# Patient Record
Sex: Female | Born: 2000 | Race: Black or African American | Hispanic: No | Marital: Single | Smoking: Never smoker
Health system: Southern US, Community
[De-identification: ages and names within clinical notes are randomized; demographics above are authoritative.]

## PROBLEM LIST (undated history)

## (undated) ENCOUNTER — Inpatient Hospital Stay (HOSPITAL_COMMUNITY): Payer: Self-pay

## (undated) DIAGNOSIS — O139 Gestational [pregnancy-induced] hypertension without significant proteinuria, unspecified trimester: Secondary | ICD-10-CM

## (undated) DIAGNOSIS — F419 Anxiety disorder, unspecified: Secondary | ICD-10-CM

## (undated) DIAGNOSIS — Z789 Other specified health status: Secondary | ICD-10-CM

## (undated) DIAGNOSIS — D649 Anemia, unspecified: Secondary | ICD-10-CM

## (undated) DIAGNOSIS — R519 Headache, unspecified: Secondary | ICD-10-CM

## (undated) DIAGNOSIS — F32A Depression, unspecified: Secondary | ICD-10-CM

## (undated) HISTORY — DX: Other specified health status: Z78.9

## (undated) HISTORY — PX: NO PAST SURGERIES: SHX2092

---
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
## 2019-07-18 ENCOUNTER — Encounter (HOSPITAL_COMMUNITY): Payer: Self-pay | Admitting: Emergency Medicine

## 2019-07-18 ENCOUNTER — Encounter (HOSPITAL_COMMUNITY): Payer: Self-pay

## 2019-07-18 ENCOUNTER — Emergency Department (HOSPITAL_COMMUNITY): Payer: Medicaid Other

## 2019-07-18 ENCOUNTER — Emergency Department (HOSPITAL_COMMUNITY)
Admission: EM | Admit: 2019-07-18 | Discharge: 2019-07-19 | Disposition: A | Discharge status: Home / Self Care | Payer: Medicaid Other | Attending: Emergency Medicine | Admitting: Emergency Medicine

## 2019-07-18 ENCOUNTER — Ambulatory Visit (HOSPITAL_COMMUNITY)
Admission: EM | Admit: 2019-07-18 | Discharge: 2019-07-18 | Disposition: A | Discharge status: Home / Self Care | Payer: Medicaid Other | Attending: Emergency Medicine | Admitting: Emergency Medicine

## 2019-07-18 ENCOUNTER — Other Ambulatory Visit: Payer: Self-pay

## 2019-07-18 DIAGNOSIS — Z0189 Encounter for other specified special examinations: Secondary | ICD-10-CM

## 2019-07-18 DIAGNOSIS — Y929 Unspecified place or not applicable: Secondary | ICD-10-CM | POA: Diagnosis not present

## 2019-07-18 DIAGNOSIS — Y999 Unspecified external cause status: Secondary | ICD-10-CM | POA: Diagnosis not present

## 2019-07-18 DIAGNOSIS — S92515A Nondisplaced fracture of proximal phalanx of left lesser toe(s), initial encounter for closed fracture: Secondary | ICD-10-CM

## 2019-07-18 DIAGNOSIS — Z3202 Encounter for pregnancy test, result negative: Secondary | ICD-10-CM

## 2019-07-18 DIAGNOSIS — Y9302 Activity, running: Secondary | ICD-10-CM | POA: Diagnosis not present

## 2019-07-18 DIAGNOSIS — W2201XA Walked into wall, initial encounter: Secondary | ICD-10-CM | POA: Insufficient documentation

## 2019-07-18 DIAGNOSIS — S99922A Unspecified injury of left foot, initial encounter: Secondary | ICD-10-CM | POA: Diagnosis present

## 2019-07-18 LAB — POCT PREGNANCY, URINE: Preg Test, Ur: NEGATIVE

## 2019-07-18 NOTE — Discharge Instructions (Addendum)
Your urine pregnancy test is negative.

## 2019-07-18 NOTE — ED Triage Notes (Signed)
Pt. Stated she turned a corner fast after running through the house and heard a crack in the left two toes.

## 2019-07-18 NOTE — ED Triage Notes (Signed)
Patient presents to Urgent Care with complaints of wanting a pregnancy test. Patient reports she took tests at home and they were negative, pt has not missed a period, but her family members told her she can still have a period if she is pregnant.

## 2019-07-18 NOTE — ED Provider Notes (Signed)
[facility]    CSN: [id number] Arrival date & time: 07/18/19  0802      History   Chief Complaint Chief Complaint  Patient presents with  . Possible Pregnancy    HPI Paula Nunez is a 18 y.o. female.   Patient presents with request for pregnancy test.  She reports intermittent nausea and "sore" breast times several weeks.  She has not missed any periods but would like to be tested.  She denies vomiting, diarrhea, abdominal pain, dysuria, back pain, or other symptoms.  LMP: 07/06/2019.  She reports taking home test and it was negative.  The history is provided by the patient.    History reviewed. No pertinent past medical history.  There are no active problems to display for this patient.   History reviewed. No pertinent surgical history.  OB History   No obstetric history on file.      Home Medications    Prior to Admission medications   Not on File    Family History Family History  Problem Relation Age of Onset  . Healthy Mother   . Healthy Father     Social History Social History   Tobacco Use  . Smoking status: Never Smoker  . Smokeless tobacco: Never Used  Substance Use Topics  . Alcohol use: Not Currently  . Drug use: Not on file     Allergies   Patient has no known allergies.   Review of Systems Review of Systems  Constitutional: Negative for chills and fever.  HENT: Negative for ear pain and sore throat.   Eyes: Negative for pain and visual disturbance.  Respiratory: Negative for cough and shortness of breath.   Cardiovascular: Negative for chest pain and palpitations.  Gastrointestinal: Positive for nausea. Negative for abdominal pain and vomiting.  Genitourinary: Negative for dysuria and hematuria.  Musculoskeletal: Negative for arthralgias and back pain.  Skin: Negative for color change and rash.  Neurological: Negative for seizures and syncope.  All other systems reviewed and are negative.    Physical Exam  Triage Vital Signs ED Triage Vitals  Enc Vitals Group     BP      Pulse      Resp      Temp      Temp src      SpO2      Weight      Height      Head Circumference      Peak Flow      Pain Score      Pain Loc      Pain Edu?      Excl. in [place]?    No data found.  Updated Vital Signs BP 133/60 (BP Location: Left Arm)   Pulse 71   Temp 98.7 F (37.1 C) (Oral)   Resp 16   LMP 07/06/2019 (Exact Date)   SpO2 100%   Visual Acuity Right Eye Distance:   Left Eye Distance:   Bilateral Distance:    Right Eye Near:   Left Eye Near:    Bilateral Near:     Physical Exam Vitals signs and nursing note reviewed.  Constitutional:      General: She is not in acute distress.    Appearance: She is well-developed.  HENT:     Head: Normocephalic and atraumatic.     Mouth/Throat:     Mouth: Mucous membranes are moist.     Pharynx: Oropharynx is clear.  Eyes:     Conjunctiva/sclera: Conjunctivae  normal.  Neck:     Musculoskeletal: Neck supple.  Cardiovascular:     Rate and Rhythm: Normal rate and regular rhythm.     Heart sounds: No murmur.  Pulmonary:     Effort: Pulmonary effort is normal. No respiratory distress.     Breath sounds: Normal breath sounds.  Abdominal:     General: Bowel sounds are normal.     Palpations: Abdomen is soft.     Tenderness: There is no abdominal tenderness. There is no right CVA tenderness, left CVA tenderness, guarding or rebound.  Skin:    General: Skin is warm and dry.     Findings: No rash.  Neurological:     Mental Status: She is alert.  Psychiatric:        Mood and Affect: Mood normal.        Behavior: Behavior normal.      UC Treatments / Results  Labs (all labs ordered are listed, but only abnormal results are displayed) Labs Reviewed  POC URINE PREG, ED  POCT PREGNANCY, URINE    EKG   Radiology No results found.  Procedures Procedures (including critical care time)  Medications Ordered in UC Medications - No data  to display  Initial Impression / Assessment and Plan / UC Course  I have reviewed the triage vital signs and the nursing notes.  Pertinent labs & imaging results that were available during my care of the patient were reviewed by me and considered in my medical decision making (see chart for details).   Patient request for pregnancy test due to intermittent nausea and breast tenderness.  Urine pregnancy negative.  Discussed test results with patient and instructed her to follow-up with her PCP if her symptoms persist.  Patient agrees to plan of care. Final Clinical Impressions(s) / UC Diagnoses   Final diagnoses:  Patient request for diagnostic testing     Discharge Instructions     Your urine pregnancy test is negative.    ED Prescriptions    None     PDMP not reviewed this encounter.   Mickie Bail, NP 07/18/19 [phone]

## 2019-07-19 MED ORDER — IBUPROFEN 400 MG PO TABS
600.0000 mg | ORAL_TABLET | Freq: Once | ORAL | Status: AC
  Administered 2019-07-19: 600 mg via ORAL
  Filled 2019-07-19: qty 1

## 2019-07-19 MED ORDER — IBUPROFEN 600 MG PO TABS: 600.0000 mg | ORAL_TABLET | Freq: Four times a day (QID) | ORAL | 0 refills | Status: DC | PRN

## 2019-07-19 NOTE — ED Notes (Signed)
Patient verbalizes understanding of discharge instructions. Opportunity for questioning and answers were provided. Armband removed by staff, pt discharged from ED. Pt. ambulatory and discharged home.  

## 2019-07-19 NOTE — Discharge Instructions (Addendum)
Take ibuprofen for pain. Elevate to reduce any swelling. Follow up with your doctor as needed.

## 2019-07-19 NOTE — ED Provider Notes (Signed)
[facility] EMERGENCY DEPARTMENT Provider Note   CSN: [id number] Arrival date & time: 07/18/19  2313     History   Chief Complaint Chief Complaint  Patient presents with  . Leg Injury    HPI Paula Nunez is a 18 y.o. female.     Patient to ED with left 4th toe injury after hitting the interior corner of a wall while running. No other injury.   The history is provided by the patient. No language interpreter was used.    History reviewed. No pertinent past medical history.  There are no active problems to display for this patient.   History reviewed. No pertinent surgical history.   OB History   No obstetric history on file.      Home Medications    Prior to Admission medications   Not on File    Family History Family History  Problem Relation Age of Onset  . Healthy Mother   . Healthy Father     Social History Social History   Tobacco Use  . Smoking status: Never Smoker  . Smokeless tobacco: Never Used  Substance Use Topics  . Alcohol use: Not Currently  . Drug use: Not Currently     Allergies   Patient has no known allergies.   Review of Systems Review of Systems  Musculoskeletal:       See HPI.  Skin: Negative for color change and wound.  Neurological: Negative for numbness.     Physical Exam Updated Vital Signs BP 130/66 (BP Location: Left Arm)   Pulse 80   Temp 98.4 F (36.9 C) (Oral)   Resp 14   Ht 5\' 5"  (1.651 m)   Wt 79.4 kg   LMP 07/18/2019   SpO2 99%   BMI 29.12 kg/m   Physical Exam Constitutional:      Appearance: She is well-developed.  Neck:     Musculoskeletal: Normal range of motion.  Pulmonary:     Effort: Pulmonary effort is normal.  Musculoskeletal:     Comments: Left 4th toe minimally swollen without deformity or discoloration. Tender. No MT tenderness.   Skin:    General: Skin is warm and dry.     Findings: No erythema or lesion.  Neurological:     Mental Status: She is  alert and oriented to person, place, and time.     Sensory: No sensory deficit.      ED Treatments / Results  Labs (all labs ordered are listed, but only abnormal results are displayed) Labs Reviewed - No data to display  EKG None  Radiology Dg Foot Complete Left  Result Date: 07/19/2019 CLINICAL DATA:  Multiple toe injuries EXAM: LEFT FOOT - COMPLETE 3+ VIEW COMPARISON:  None. FINDINGS: There is a nondisplaced linear lucency, likely obliquely oriented fracture seen through the proximal fourth phalanx. No other fracture seen. There is no evidence of arthropathy or other focal bone abnormality. Dorsal soft tissue swelling. IMPRESSION: Probable nondisplaced fracture of the proximal fourth phalanx. Dorsal soft tissue swelling. Electronically Signed   By: 07/21/2019 M.D.   On: 07/19/2019 00:06    Procedures Procedures (including critical care time)  Medications Ordered in ED Medications  ibuprofen (ADVIL) tablet 600 mg (has no administration in time range)     Initial Impression / Assessment and Plan / ED Course  I have reviewed the triage vital signs and the nursing notes.  Pertinent labs & imaging results that were available during my care of the patient  were reviewed by me and considered in my medical decision making (see chart for details).        Patient to ED with left 4th toe injury. X-ray show ND fracture line of proximal phalynx.   Toe buddy taped, post op shoe provided for comfort. Rx ibuprofen.  Final Clinical Impressions(s) / ED Diagnoses   Final diagnoses:  None   1. 4th toe fracture   ED Discharge Orders    None       Charlann Lange, PA-C 07/19/19 0049    Ward, Delice Bison, DO 07/19/19 0100

## 2020-03-10 ENCOUNTER — Inpatient Hospital Stay (HOSPITAL_COMMUNITY)
Admission: AD | Admit: 2020-03-10 | Discharge: 2020-03-10 | Disposition: A | Discharge status: Home / Self Care | Payer: Medicaid Other | Attending: Obstetrics & Gynecology | Admitting: Obstetrics & Gynecology

## 2020-03-10 ENCOUNTER — Other Ambulatory Visit: Payer: Self-pay

## 2020-03-10 DIAGNOSIS — Z3201 Encounter for pregnancy test, result positive: Secondary | ICD-10-CM | POA: Diagnosis present

## 2020-03-10 NOTE — Discharge Instructions (Signed)
[facility] Prenatal Care Providers   Center for [facility] at [facility]       Phone: [phone]  Center for [facility] at [place]                  Phone: [phone]  [facility] [place] Phone: [phone]  [facility] [place]  Phone: [phone]  [facility]  Phone: [phone]  [facility] for [facility] at [place]  Phone: [phone]  [facility]       Phone: [phone]  [facility]    Phone: [phone]   [facility]    Phone: [phone]  [facility]    Phone: [phone]  [facility]    Phone: [phone]   [facility]  Phone: [phone]  [facility]    Phone: [phone]  [facility]   Phone: [phone]  [facility]    Phone: [phone]  Safe Medications in Pregnancy   Acne: Benzoyl Peroxide Salicylic Acid  Backache/Headache: Tylenol: 2 regular strength every 4 hours OR              2 Extra strength every 6 hours  Colds/Coughs/Allergies: Benadryl (alcohol free) 25 mg every 6 hours as needed Breath right strips Claritin Cepacol throat lozenges Chloraseptic throat spray Cold-Eeze- up to three times per day Cough drops, alcohol free Flonase (by prescription only) Guaifenesin Mucinex Robitussin DM (plain only, alcohol free) Saline nasal spray/drops Sudafed (pseudoephedrine) & Actifed ** use only after [redacted] weeks gestation and if you do not have high blood pressure Tylenol Vicks Vaporub Zinc lozenges Zyrtec   Constipation: Colace Ducolax suppositories Fleet enema Glycerin suppositories Metamucil Milk of magnesia Miralax Senokot Smooth move tea  Diarrhea: Kaopectate Imodium A-D  *NO pepto  Bismol  Hemorrhoids: Anusol Anusol HC Preparation H Tucks  Indigestion: Tums Maalox Mylanta Zantac  Pepcid  Insomnia: Benadryl (alcohol free) 25mg  every 6 hours as needed Tylenol PM Unisom, no Gelcaps  Leg Cramps: Tums MagGel  Nausea/Vomiting:  Bonine Dramamine Emetrol Ginger extract Sea bands Meclizine  Nausea medication to take during pregnancy:  Unisom (doxylamine succinate 25 mg tablets) Take one tablet daily at bedtime. If symptoms are not adequately controlled, the dose can be increased to a maximum recommended dose of two tablets daily (1/2 tablet in the morning, 1/2 tablet mid-afternoon and one at bedtime). Vitamin B6 100mg  tablets. Take one tablet twice a day (up to 200 mg per day).  Skin Rashes: Aveeno products Benadryl cream or 25mg  every 6 hours as needed Calamine Lotion 1% cortisone cream  Yeast infection: Gyne-lotrimin 7 Monistat 7  Gum/tooth pain: Anbesol  **If taking multiple medications, please check labels to avoid duplicating the same active ingredients **take medication as directed on the label ** Do not exceed 4000 mg of tylenol in 24 hours **Do not take medications that contain aspirin or ibuprofen   Contraception Choices Contraception, also called birth control, refers to methods or devices that prevent pregnancy. Hormonal methods Contraceptive implant  A contraceptive implant is a thin, plastic tube that contains a hormone. It is inserted into the upper part of the arm. It can remain in place for up to 3 years. Progestin-only injections Progestin-only injections are injections of progestin, a synthetic form of the hormone progesterone. They are given every 3 months by a health care provider. Birth control pills  Birth control  pills are pills that contain hormones that prevent pregnancy. They must be taken once a day, preferably at the same time each day. Birth control patch  The birth control patch contains hormones that  prevent pregnancy. It is placed on the skin and must be changed once a week for three weeks and removed on the fourth week. A prescription is needed to use this method of contraception. Vaginal ring  A vaginal ring contains hormones that prevent pregnancy. It is placed in the vagina for three weeks and removed on the fourth week. After that, the process is repeated with a new ring. A prescription is needed to use this method of contraception. Emergency contraceptive Emergency contraceptives prevent pregnancy after unprotected sex. They come in pill form and can be taken up to 5 days after sex. They work best the sooner they are taken after having sex. Most emergency contraceptives are available without a prescription. This method should not be used as your only form of birth control. Barrier methods Female condom  A female condom is a thin sheath that is worn over the penis during sex. Condoms keep sperm from going inside a woman's body. They can be used with a spermicide to increase their effectiveness. They should be disposed after a single use. Female condom  A female condom is a soft, loose-fitting sheath that is put into the vagina before sex. The condom keeps sperm from going inside a woman's body. They should be disposed after a single use. Diaphragm  A diaphragm is a soft, dome-shaped barrier. It is inserted into the vagina before sex, along with a spermicide. The diaphragm blocks sperm from entering the uterus, and the spermicide kills sperm. A diaphragm should be left in the vagina for 6-8 hours after sex and removed within 24 hours. A diaphragm is prescribed and fitted by a health care provider. A diaphragm should be replaced every 1-2 years, after giving birth, after gaining more than 15 lb (6.8 kg), and after pelvic surgery. Cervical cap  A cervical cap is a round, soft latex or plastic cup that fits over the cervix. It is inserted into the vagina before sex, along with spermicide. It  blocks sperm from entering the uterus. The cap should be left in place for 6-8 hours after sex and removed within 48 hours. A cervical cap must be prescribed and fitted by a health care provider. It should be replaced every 2 years. Sponge  A sponge is a soft, circular piece of polyurethane foam with spermicide on it. The sponge helps block sperm from entering the uterus, and the spermicide kills sperm. To use it, you make it wet and then insert it into the vagina. It should be inserted before sex, left in for at least 6 hours after sex, and removed and thrown away within 30 hours. Spermicides Spermicides are chemicals that kill or block sperm from entering the cervix and uterus. They can come as a cream, jelly, suppository, foam, or tablet. A spermicide should be inserted into the vagina with an applicator at least 10-15 minutes before sex to allow time for it to work. The process must be repeated every time you have sex. Spermicides do not require a prescription. Intrauterine contraception Intrauterine device (IUD) An IUD is a T-shaped device that is put in a woman's uterus. There are two types:  Hormone IUD.This type contains progestin, a synthetic form of the hormone progesterone. This type can stay in place for 3-5 years.  Copper IUD.This type is wrapped  in copper wire. It can stay in place for 10 years.  Permanent methods of contraception Female tubal ligation In this method, a woman's fallopian tubes are sealed, tied, or blocked during surgery to prevent eggs from traveling to the uterus. Hysteroscopic sterilization In this method, a small, flexible insert is placed into each fallopian tube. The inserts cause scar tissue to form in the fallopian tubes and block them, so sperm cannot reach an egg. The procedure takes about 3 months to be effective. Another form of birth control must be used during those 3 months. Female sterilization This is a procedure to tie off the tubes that carry sperm  (vasectomy). After the procedure, the man can still ejaculate fluid (semen). Natural planning methods Natural family planning In this method, a couple does not have sex on days when the woman could become pregnant. Calendar method This means keeping track of the length of each menstrual cycle, identifying the days when pregnancy can happen, and not having sex on those days. Ovulation method In this method, a couple avoids sex during ovulation. Symptothermal method This method involves not having sex during ovulation. The woman typically checks for ovulation by watching changes in her temperature and in the consistency of cervical mucus. Post-ovulation method In this method, a couple waits to have sex until after ovulation. Summary  Contraception, also called birth control, means methods or devices that prevent pregnancy.  Hormonal methods of contraception include implants, injections, pills, patches, vaginal rings, and emergency contraceptives.  Barrier methods of contraception can include female condoms, female condoms, diaphragms, cervical caps, sponges, and spermicides.  There are two types of IUDs (intrauterine devices). An IUD can be put in a woman's uterus to prevent pregnancy for 3-5 years.  Permanent sterilization can be done through a procedure for males, females, or both.  Natural family planning methods involve not having sex on days when the woman could become pregnant. This information is not intended to replace advice given to you by your health care provider. Make sure you discuss any questions you have with your health care provider. Document Revised: 09/13/2017 Document Reviewed: 10/14/2016 Elsevier Patient Education  2020 [employer].

## 2020-03-10 NOTE — MAU Note (Signed)
I took upt last night and was positive. Just want to be sure I am pregnant. LMP 02/07/20. Denies any pain or vag bleeding

## 2020-03-10 NOTE — MAU Note (Addendum)
Gerrit Heck CNM in Triage to talk with pt regarding plan of care. Anna Cioci RN in with d/c papers and reviewed d/c plan with pt. Pt then d/c home from Triage

## 2020-03-10 NOTE — MAU Provider Note (Signed)
First Provider Initiated Contact with Patient 03/10/20 1934      S [name] is a 19 y.o. at 4.4 weeks by LMP of 02/07/2020 who presents to MAU today for confirmation of pregnancy. She states she had a positive UPT yesterday x 2.  Patient states that she has had irregular menses, but last period was 3 days despite starting after sexual activity. Patient denies vaginal concerns including bleeding, discharge, or odor.  She also denies abdominal pain or cramping.   O BP (!) 142/61 (BP Location: Right Arm)   Pulse 86   Temp 98.6 F (37 C)   Resp 16   Ht 5\' 5"  (1.651 m)   Wt 74.8 kg   LMP 02/07/2020   BMI 27.46 kg/m  Physical Exam  Nursing note and vitals reviewed. Constitutional: She is oriented to person, place, and time.  HENT:  Head: Normocephalic and atraumatic.  Eyes: Conjunctivae are normal.  Cardiovascular: Normal rate, regular rhythm and normal heart sounds.  Respiratory: Effort normal and breath sounds normal. No respiratory distress.  GI: Normal appearance and bowel sounds are normal. She exhibits no distension. There is no abdominal tenderness.  Musculoskeletal:     Cervical back: Normal range of motion.  Neurological: She is alert and oriented to person, place, and time.  Skin: Skin is warm and dry.  Psychiatric: Mood and thought content normal.    A Positive Home UPT Medical screening exam complete 4.4 weeks by LMP  P -Informed that if home UPT is positive, she is pregnant! -Congratulations given. -Informed of elevated blood pressure today and precautions given.  -Instructed to go to local pharmacy, for bp check, if concerns arise.  -Given list of providers, pregnancy safe medications, and contraception choices. -Bleeding precautions given.  -Discharge from MAU in stable condition -Warning signs for worsening condition that would warrant emergency follow-up discussed -Patient may return to MAU as needed for pregnancy related complaints  02/09/2020, CNM 03/10/2020 7:35 PM

## 2020-04-12 ENCOUNTER — Other Ambulatory Visit: Payer: Self-pay

## 2020-04-12 ENCOUNTER — Encounter: Payer: Self-pay | Admitting: General Practice

## 2020-04-12 ENCOUNTER — Ambulatory Visit (INDEPENDENT_AMBULATORY_CARE_PROVIDER_SITE_OTHER): Payer: Medicaid Other | Admitting: *Deleted

## 2020-04-12 VITALS — BP 113/77 | HR 85 | Temp 98.6°F | Wt 172.2 lb

## 2020-04-12 DIAGNOSIS — Z3201 Encounter for pregnancy test, result positive: Secondary | ICD-10-CM | POA: Diagnosis not present

## 2020-04-12 DIAGNOSIS — Z789 Other specified health status: Secondary | ICD-10-CM

## 2020-04-12 DIAGNOSIS — Z3400 Encounter for supervision of normal first pregnancy, unspecified trimester: Secondary | ICD-10-CM | POA: Insufficient documentation

## 2020-04-12 DIAGNOSIS — Z3200 Encounter for pregnancy test, result unknown: Secondary | ICD-10-CM

## 2020-04-12 LAB — POCT URINE PREGNANCY: Preg Test, Ur: POSITIVE — AB

## 2020-04-12 MED ORDER — BLOOD PRESSURE MONITOR AUTOMAT DEVI: 1.0000 | Freq: Every day | 0 refills | Status: DC

## 2020-04-12 MED ORDER — GOJJI WEIGHT SCALE MISC: 1.0000 | Freq: Every day | 0 refills | Status: DC | PRN

## 2020-04-12 MED ORDER — VITAFOL GUMMIES 3.33-0.333-34.8 MG PO CHEW: 3.0000 | CHEWABLE_TABLET | Freq: Every day | ORAL | 12 refills | Status: DC

## 2020-04-12 NOTE — Progress Notes (Signed)
   PRENATAL INTAKE SUMMARY  Ms. Mcgovern presents today New OB Nurse Interview.  OB History    Gravida  1   Para      Term      Preterm      AB      Living        SAB      TAB      Ectopic      Multiple      Live Births             I have reviewed the patient's medical, obstetrical, social, and family histories, medications, and available lab results.  SUBJECTIVE She has no unusual complaints.   OBJECTIVE Initial nurse interview for history (New OB)  GENERAL APPEARANCE: alert, well appearing, in no apparent distress, oriented to person, place and time   ASSESSMENT Positive Pregnancy test Normal pregnancy  PLAN Prenatal care-[facility] to be completed at next visit with Gerrit Heck, CNM 05/07/20 Ultrasound ordered to confirm dating/viability due to unknown LMP Rx for BP monitor and weight scale sent to [facility] Rx for PNV sent to pharmacy Patient to sign up for Babyscripts Patient enrolled in [place] [facility] [facility] [employer]Community Navigator Ty Jean Rosenthal will contact patient due to issues keeping food in household. Also informed of [facility] will have food bank available soon.     Clovis Pu, RN

## 2020-04-12 NOTE — Patient Instructions (Addendum)
First Trimester of Pregnancy  The first trimester of pregnancy is from week 1 until the end of week 13 (months 1 through 3). During this time, your baby will begin to develop inside you. At 6-8 weeks, the eyes and face are formed, and the heartbeat can be seen on ultrasound. At the end of 12 weeks, all the baby's organs are formed. Prenatal care is all the medical care you receive before the birth of your baby. Make sure you get good prenatal care and follow all of your doctor's instructions. Follow these instructions at home: Medicines  Take over-the-counter and prescription medicines only as told by your doctor. Some medicines are safe and some medicines are not safe during pregnancy.  Take a prenatal vitamin that contains at least 600 micrograms (mcg) of folic acid.  If you have trouble pooping (constipation), take medicine that will make your stool soft (stool softener) if your doctor approves. Eating and drinking   Eat regular, healthy meals.  Your doctor will tell you the amount of weight gain that is right for you.  Avoid raw meat and uncooked cheese.  If you feel sick to your stomach (nauseous) or throw up (vomit): ? Eat 4 or 5 small meals a day instead of 3 large meals. ? Try eating a few soda crackers. ? Drink liquids between meals instead of during meals.  To prevent constipation: ? Eat foods that are high in fiber, like fresh fruits and vegetables, whole grains, and beans. ? Drink enough fluids to keep your pee (urine) clear or pale yellow. Activity  Exercise only as told by your doctor. Stop exercising if you have cramps or pain in your lower belly (abdomen) or low back.  Do not exercise if it is too hot, too humid, or if you are in a place of great height (high altitude).  Try to avoid standing for long periods of time. Move your legs often if you must stand in one place for a long time.  Avoid heavy lifting.  Wear low-heeled shoes. Sit and stand up  straight.  You can have sex unless your doctor tells you not to. Relieving pain and discomfort  Wear a good support bra if your breasts are sore.  Take warm water baths (sitz baths) to soothe pain or discomfort caused by hemorrhoids. Use hemorrhoid cream if your doctor says it is okay.  Rest with your legs raised if you have leg cramps or low back pain.  If you have puffy, bulging veins (varicose veins) in your legs: ? Wear support hose or compression stockings as told by your doctor. ? Raise (elevate) your feet for 15 minutes, 3-4 times a day. ? Limit salt in your food. Prenatal care  Schedule your prenatal visits by the twelfth week of pregnancy.  Write down your questions. Take them to your prenatal visits.  Keep all your prenatal visits as told by your doctor. This is important. Safety  Wear your seat belt at all times when driving.  Make a list of emergency phone numbers. The list should include numbers for family, friends, the hospital, and police and fire departments. General instructions  Ask your doctor for a referral to a local prenatal class. Begin classes no later than at the start of month 6 of your pregnancy.  Ask for help if you need counseling or if you need help with nutrition. Your doctor can give you advice or tell you where to go for help.  Do not use hot tubs, steam   tubs, steam rooms, or saunas.  Do not douche or use tampons or scented sanitary pads.  Do not cross your legs for long periods of time.  Avoid all herbs and alcohol. Avoid drugs that are not approved by your doctor.  Do not use any tobacco products, including cigarettes, chewing tobacco, and electronic cigarettes. If you need help quitting, ask your doctor. You may get counseling or other support to help you quit.  Avoid cat litter boxes and soil used by cats. These carry germs that can cause birth defects in the baby and can cause a loss of your baby (miscarriage) or stillbirth.  Visit your dentist.  At home, brush your teeth with a soft toothbrush. Be gentle when you floss. Contact a doctor if:  You are dizzy.  You have mild cramps or pressure in your lower belly.  You have a nagging pain in your belly area.  You continue to feel sick to your stomach, you throw up, or you have watery poop (diarrhea).  You have a bad smelling fluid coming from your vagina.  You have pain when you pee (urinate).  You have increased puffiness (swelling) in your face, hands, legs, or ankles. Get help right away if:  You have a fever.  You are leaking fluid from your vagina.  You have spotting or bleeding from your vagina.  You have very bad belly cramping or pain.  You gain or lose weight rapidly.  You throw up blood. It may look like coffee grounds.  You are around people who have [place] measles, fifth disease, or chickenpox.  You have a very bad headache.  You have shortness of breath.  You have any kind of trauma, such as from a fall or a car accident. Summary  The first trimester of pregnancy is from week 1 until the end of week 13 (months 1 through 3).  To take care of yourself and your unborn baby, you will need to eat healthy meals, take medicines only if your doctor tells you to do so, and do activities that are safe for you and your baby.  Keep all follow-up visits as told by your doctor. This is important as your doctor will have to ensure that your baby is healthy and growing well. This information is not intended to replace advice given to you by your health care provider. Make sure you discuss any questions you have with your health care provider. Document Revised: 01/02/2019 Document Reviewed: 09/19/2016 Elsevier Patient Education  2020 [employer].  Warning Signs During Pregnancy A pregnancy lasts about 40 weeks, starting from the first day of your last period until the baby is born. Pregnancy is divided into three phases called trimesters.  The first trimester  refers to week 1 through week 13 of pregnancy.  The second trimester is the start of week 14 through the end of week 27.  The third trimester is the start of week 28 until you deliver your baby. During each trimester of pregnancy, certain signs and symptoms may indicate a problem. Talk with your health care provider about your current health and any medical conditions you have. Make sure you know the symptoms that you should watch for and report. How does this affect me?  Warning signs in the first trimester While some changes during the first trimester may be uncomfortable, most do not represent a serious problem. Let your health care provider know if you have any of the following warning signs in the first trimester:  You cannot eat or drink without vomiting, and this lasts for longer than a day.  You have vaginal bleeding or spotting along with menstrual-like cramping.  You have diarrhea for longer than a day.  You have a fever or other signs of infection, such as: ? Pain or burning when you urinate. ? Foul smelling or thick or yellowish vaginal discharge. Warning signs in the second trimester As your baby grows and changes during the second trimester, there are additional signs and symptoms that may indicate a problem. These include:  Signs and symptoms of infection, including a fever.  Signs or symptoms of a miscarriage or preterm labor, such as regular contractions, menstrual-like cramping, or lower abdominal pain.  Bloody or watery vaginal discharge or obvious vaginal bleeding.  Feeling like your heart is pounding.  Having trouble breathing.  Nausea, vomiting, or diarrhea that lasts for longer than a day.  Craving non-food items, such as clay, chalk, or dirt. This may be a sign of a very treatable medical condition called pica. Later in your second trimester, watch for signs and symptoms of a serious medical condition called preeclampsia.These include:  Changes in your  vision.  A severe headache that does not go away.  Nausea and vomiting. It is also important to notice if your baby stops moving or moves less than usual during this time. Warning signs in the third trimester As you approach the third trimester, your baby is growing and your body is preparing for the birth of your baby. In your third trimester, be sure to let your health care provider know if:  You have signs and symptoms of infection, including a fever.  You have vaginal bleeding.  You notice that your baby is moving less than usual or is not moving.  You have nausea, vomiting, or diarrhea that lasts for longer than a day.  You have a severe headache that does not go away.  You have vision changes, including seeing spots or having blurry or double vision.  You have increased swelling in your hands or face. How does this affect my baby? Throughout your pregnancy, always report any of the warning signs of a problem to your health care provider. This can help prevent complications that may affect your baby, including:  Increased risk for premature birth.  Infection that may be transmitted to your baby.  Increased risk for stillbirth. Contact a health care provider if:  You have any of the warning signs of a problem for the current trimester of your pregnancy.  Any of the following apply to you during any trimester of pregnancy: ? You have strong emotions, such as sadness or anxiety, that interfere with work or personal relationships. ? You feel unsafe in your home and need help finding a safe place to live. ? You are using tobacco products, alcohol, or drugs and you need help to stop. Get help right away if: You have signs or symptoms of labor before 37 weeks of pregnancy. These include:  Contractions that are 5 minutes or less apart, or that increase in frequency, intensity, or length.  Sudden, sharp abdominal pain or low back pain.  Uncontrolled gush or trickle of fluid  from your vagina. Summary  A pregnancy lasts about 40 weeks, starting from the first day of your last period until the baby is born. Pregnancy is divided into three phases called trimesters. Each trimester has warning signs to watch for.  Always report any warning signs to your health care provider in  order to prevent complications that may affect both you and your baby.  Talk with your health care provider about your current health and any medical conditions you have. Make sure you know the symptoms that you should watch for and report. This information is not intended to replace advice given to you by your health care provider. Make sure you discuss any questions you have with your health care provider. Document Revised: 12/31/2018 Document Reviewed: 06/28/2017 Elsevier Patient Education  [place].  How to Take Your Blood Pressure You can take your blood pressure at home with a machine. You may need to check your blood pressure at home:  To check if you have high blood pressure (hypertension).  To check your blood pressure over time.  To make sure your blood pressure medicine is working. Supplies needed: You will need a blood pressure machine, or monitor. You can buy one at a drugstore or online. When choosing one:  Choose one with an arm cuff.  Choose one that wraps around your upper arm. Only one finger should fit between your arm and the cuff.  Do not choose one that measures your blood pressure from your wrist or finger. Your doctor can suggest a monitor. How to prepare Avoid these things for 30 minutes before checking your blood pressure:  Drinking caffeine.  Drinking alcohol.  Eating.  Smoking.  Exercising. Five minutes before checking your blood pressure:  Pee.  Sit in a dining chair. Avoid sitting in a soft couch or armchair.  Be quiet. Do not talk. How to take your blood pressure Follow the instructions that came with your machine. If you have a  digital blood pressure monitor, these may be the instructions: 1. Sit up straight. 2. Place your feet on the floor. Do not cross your ankles or legs. 3. Rest your left arm at the level of your heart. You may rest it on a table, desk, or chair. 4. Pull up your shirt sleeve. 5. Wrap the blood pressure cuff around the upper part of your left arm. The cuff should be 1 inch (2.5 cm) above your elbow. It is best to wrap the cuff around bare skin. 6. Fit the cuff snugly around your arm. You should be able to place only one finger between the cuff and your arm. 7. Put the cord inside the groove of your elbow. 8. Press the power button. 9. Sit quietly while the cuff fills with air and loses air. 10. Write down the numbers on the screen. 11. Wait 2-3 minutes and then repeat steps 1-10. What do the numbers mean? Two numbers make up your blood pressure. The first number is called systolic pressure. The second is called diastolic pressure. An example of a blood pressure reading is "120 over 80" (or 120/80). If you are an adult and do not have a medical condition, use this guide to find out if your blood pressure is normal: Normal  First number: below 120.  Second number: below 80. Elevated  First number: 120-129.  Second number: below 80. Hypertension stage 1  First number: 130-139.  Second number: 80-89. Hypertension stage 2  First number: 140 or above.  Second number: 68 or above. Your blood pressure is above normal even if only the top or bottom number is above normal. Follow these instructions at home:  Check your blood pressure as often as your doctor tells you to.  Take your monitor to your next doctor's appointment. Your doctor will: ? Make sure  you are using it correctly. ? Make sure it is working right.  Make sure you understand what your blood pressure numbers should be.  Tell your doctor if your medicines are causing side effects. Contact a doctor if:  Your blood  pressure keeps being high. Get help right away if:  Your first blood pressure number is higher than 180.  Your second blood pressure number is higher than 120. This information is not intended to replace advice given to you by your health care provider. Make sure you discuss any questions you have with your health care provider. Document Revised: 08/24/2017 Document Reviewed: 02/18/2016 Elsevier Patient Education  2020 Elsevier Inc.  

## 2020-04-19 ENCOUNTER — Ambulatory Visit
Admission: RE | Admit: 2020-04-19 | Discharge: 2020-04-19 | Disposition: A | Discharge status: Home / Self Care | Payer: Medicaid Other | Source: Ambulatory Visit

## 2020-04-19 ENCOUNTER — Other Ambulatory Visit: Payer: Self-pay

## 2020-04-19 DIAGNOSIS — Z789 Other specified health status: Secondary | ICD-10-CM | POA: Insufficient documentation

## 2020-05-07 ENCOUNTER — Encounter: Payer: Self-pay | Admitting: General Practice

## 2020-05-07 ENCOUNTER — Telehealth: Payer: Self-pay | Admitting: General Practice

## 2020-05-07 ENCOUNTER — Encounter: Payer: Medicaid Other | Admitting: Certified Nurse Midwife

## 2020-05-07 NOTE — Telephone Encounter (Signed)
Patient missed NOB appt today.  Letter sent to patient via Mychart and message to contact our office to reschedule appt.

## 2020-07-13 ENCOUNTER — Other Ambulatory Visit: Payer: Self-pay

## 2020-07-13 ENCOUNTER — Ambulatory Visit (HOSPITAL_COMMUNITY)
Admission: EM | Admit: 2020-07-13 | Discharge: 2020-07-13 | Disposition: A | Discharge status: Home / Self Care | Payer: Medicaid Other | Attending: Family Medicine | Admitting: Family Medicine

## 2020-07-13 ENCOUNTER — Encounter (HOSPITAL_COMMUNITY): Payer: Self-pay

## 2020-07-13 DIAGNOSIS — Z3201 Encounter for pregnancy test, result positive: Secondary | ICD-10-CM | POA: Diagnosis not present

## 2020-07-13 DIAGNOSIS — R110 Nausea: Secondary | ICD-10-CM

## 2020-07-13 LAB — POCT URINALYSIS DIPSTICK, ED / UC
Bilirubin Urine: NEGATIVE
Glucose, UA: NEGATIVE mg/dL
Hgb urine dipstick: NEGATIVE
Ketones, ur: NEGATIVE mg/dL
Nitrite: NEGATIVE
Protein, ur: NEGATIVE mg/dL
Specific Gravity, Urine: 1.025 (ref 1.005–1.030)
Urobilinogen, UA: 0.2 mg/dL (ref 0.0–1.0)
pH: 7 (ref 5.0–8.0)

## 2020-07-13 LAB — POC URINE PREG, ED: Preg Test, Ur: POSITIVE — AB

## 2020-07-13 MED ORDER — ONDANSETRON 4 MG PO TBDP: 4.0000 mg | ORAL_TABLET | Freq: Three times a day (TID) | ORAL | 0 refills | Status: DC | PRN

## 2020-07-13 NOTE — ED Triage Notes (Signed)
Patient in requesting pregnancy test. States she was pregnant before but had a miscarriage and has not had a period since then. Last period was in May.  C/o nausea and vomiting and lower abdominal pain.  Patient has not had any medication for symptoms

## 2020-07-14 NOTE — ED Provider Notes (Signed)
[facility]   [id number] 07/13/20 Arrival Time: 1719  ASSESSMENT & PLAN:  1. Nausea   2. Positive pregnancy test     Benign abdominal exam. No indications for urgent abdominal/pelvic imaging at this time. Discussed. Urine culture sent given small leuks. No urinary symptoms.  Meds ordered this encounter  Medications   ondansetron (ZOFRAN-ODT) 4 MG disintegrating tablet    Sig: Take 1 tablet (4 mg total) by mouth every 8 (eight) hours as needed for nausea or vomiting.    Dispense:  15 tablet    Refill:  0     Follow-up Information    Schedule an appointment as soon as possible for a visit  with [facility].   Specialty: Obstetrics and Gynecology Contact information: [address] [place] [address] [phone]              Reviewed expectations re: course of current medical issues. Questions answered. Outlined signs and symptoms indicating need for more acute intervention. Patient verbalized understanding. After Visit Summary given.   SUBJECTIVE: History from: patient. Paula Nunez is a 19 y.o. female who requests pregnancy test. Patient's last menstrual period was 02/11/2020 (approximate). Nausea for the past few weeks. No abd pain.  Patient's last menstrual period was 02/11/2020 (approximate).   Past Surgical History:  Procedure Laterality Date   NO PAST SURGERIES       OBJECTIVE:  Vitals:   07/13/20 1826  BP: 132/64  Pulse: 99  Resp: 18  Temp: 98.2 F (36.8 C)  TempSrc: Oral  SpO2: 99%    General appearance: alert, oriented, no acute distress HEENT: [place]; AT; oropharynx moist Lungs: unlabored respirations Abdomen: soft; without distention; is showing; no specific tenderness to palpation; normal bowel sounds; without masses or organomegaly; without guarding or rebound tenderness Back: without reported CVA tenderness; FROM at waist Extremities: without LE  edema; symmetrical; without gross deformities Skin: warm and dry Neurologic: normal gait Psychological: alert and cooperative; normal mood and affect  Labs: Results for orders placed or performed during the hospital encounter of 07/13/20  POCT Urinalysis Dipstick (ED/UC)  Result Value Ref Range   Glucose, UA NEGATIVE NEGATIVE mg/dL   Bilirubin Urine NEGATIVE NEGATIVE   Ketones, ur NEGATIVE NEGATIVE mg/dL   Specific Gravity, Urine 1.025 1.005 - 1.030   Hgb urine dipstick NEGATIVE NEGATIVE   pH 7.0 5.0 - 8.0   Protein, ur NEGATIVE NEGATIVE mg/dL   Urobilinogen, UA 0.2 0.0 - 1.0 mg/dL   Nitrite NEGATIVE NEGATIVE   Leukocytes,Ua SMALL (A) NEGATIVE  POC urine preg, ED (not at [facility])  Result Value Ref Range   Preg Test, Ur POSITIVE (A) NEGATIVE   Labs Reviewed  POCT URINALYSIS DIPSTICK, ED / UC - Abnormal; Notable for the following components:      Result Value   Leukocytes,Ua SMALL (*)    All other components within normal limits  POC URINE PREG, ED - Abnormal; Notable for the following components:   Preg Test, Ur POSITIVE (*)    All other components within normal limits    Imaging: No results found.   No Known Allergies                                             Past Medical History:  Diagnosis Date   Medical history non-contributory  Social History   Socioeconomic History   Marital status: Single    Spouse name: Not on file   Number of children: Not on file   Years of education: Not on file   Highest education level: High school graduate  Occupational History   Occupation: [employer]  Tobacco Use   Smoking status: Never Smoker   Smokeless tobacco: Never Used  Building services engineer Use: Never used  Substance and Sexual Activity   Alcohol use: Not Currently   Drug use: Not Currently   Sexual activity: Yes    Birth control/protection: None  Other Topics Concern   Not on file  Social History Narrative   Not on file   Social Determinants of  Health   Financial Resource Strain: Low Risk    Difficulty of Paying Living Expenses: Not very hard  Food Insecurity: Food Insecurity Present   Worried About Running Out of Food in the Last Year: Sometimes true   Ran Out of Food in the Last Year: Sometimes true  Transportation Needs: No Transportation Needs   Lack of Transportation (Medical): No   Lack of Transportation (Non-Medical): No  Physical Activity:    Days of Exercise per Week: Not on file   Minutes of Exercise per Session: Not on file  Stress:    Feeling of Stress : Not on file  Social Connections:    Frequency of Communication with Friends and Family: Not on file   Frequency of Social Gatherings with Friends and Family: Not on file   Attends Religious Services: Not on file   Active Member of Clubs or Organizations: Not on file   Attends Banker Meetings: Not on file   Marital Status: Not on file  Intimate Partner Violence: Not At Risk   Fear of Current or Ex-Partner: No   Emotionally Abused: No   Physically Abused: No   Sexually Abused: No    Family History  Problem Relation Age of Onset   Healthy Mother    Healthy Father      Mardella Layman, MD 07/14/20 1006

## 2020-08-03 ENCOUNTER — Telehealth: Payer: Self-pay | Admitting: General Practice

## 2020-08-03 NOTE — Telephone Encounter (Signed)
Left message on VM for pt to give our office a call in regards to NOB appt that is scheduled for 08/11/2020.  Patient was informed at NOB intake visit (04/12/2020) that her Medicaid will need to be changed to either, [facility] [place], [facility], or [place] to continue care at this location and pt verbalized understanding.

## 2020-08-11 ENCOUNTER — Encounter: Payer: Medicaid Other | Admitting: Certified Nurse Midwife

## 2020-08-27 ENCOUNTER — Other Ambulatory Visit (HOSPITAL_COMMUNITY)
Admission: RE | Admit: 2020-08-27 | Discharge: 2020-08-27 | Disposition: A | Discharge status: Home / Self Care | Payer: Medicaid Other | Source: Ambulatory Visit

## 2020-08-27 ENCOUNTER — Other Ambulatory Visit: Payer: Self-pay

## 2020-08-27 ENCOUNTER — Ambulatory Visit (INDEPENDENT_AMBULATORY_CARE_PROVIDER_SITE_OTHER): Payer: Medicaid Other

## 2020-08-27 VITALS — BP 137/75 | HR 106 | Temp 98.3°F | Wt 189.0 lb

## 2020-08-27 DIAGNOSIS — Z3400 Encounter for supervision of normal first pregnancy, unspecified trimester: Secondary | ICD-10-CM

## 2020-08-27 DIAGNOSIS — O99012 Anemia complicating pregnancy, second trimester: Secondary | ICD-10-CM

## 2020-08-27 DIAGNOSIS — F411 Generalized anxiety disorder: Secondary | ICD-10-CM

## 2020-08-27 DIAGNOSIS — Z36 Encounter for antenatal screening for chromosomal anomalies: Secondary | ICD-10-CM | POA: Diagnosis not present

## 2020-08-27 DIAGNOSIS — Z23 Encounter for immunization: Secondary | ICD-10-CM

## 2020-08-27 DIAGNOSIS — Z3A28 28 weeks gestation of pregnancy: Secondary | ICD-10-CM

## 2020-08-27 DIAGNOSIS — O0930 Supervision of pregnancy with insufficient antenatal care, unspecified trimester: Secondary | ICD-10-CM

## 2020-08-27 NOTE — Progress Notes (Signed)
Subjective:   Paula Nunez is a 19 y.o. G1P0 at [redacted]w[redacted]d by Definite LMP being seen today for her first obstetrical visit at 28.2 weeks.    Gynecological/Obstetrical History: Her obstetrical history is significant for late to prenatal care. Patient does not intend to breast feed. Pregnancy history fully reviewed. Patient endorses fetal movement and some intermittent abdominal pain that "goes away after a second." She describes it as tightening.  Sexual Activity and Vaginal Concerns: Patient reports vaginal smell for the past 2-3 days.  She reports it is improved with showering, but returns shortly.  She states the discharge is a "not white." Patient denies bleeding or itching.  Patient denies pain or discomfort with sexual activity.   Medical History/ROS: Patient denies significant medical history.  She denies "bad anxiety, but I do know how to control."  Patient denies issues with diarrhea and reports intermittent constipation.  She denies issues with urination, nausea, or vomiting.    Social History: Patient history of MJ usage, but discontinued at discovery of pregnancy. No current usage of tobacco, alcohol, or drugs.  Patient reports the FOB is "Deshawn" who is not involved and will not be present at delivery.  Patient reports that she lives with grandmother "Paula Nunez" and endorses safety at home.  Patient denies DV/A. Patient is not currently employed and does not attend school.  HISTORY: OB History  Gravida Para Term Preterm AB Living  1 0 0 0 0 0  SAB TAB Ectopic Multiple Live Births  0 0 0 0 0    # Outcome Date GA Lbr Len/2nd Weight Sex Delivery Anes PTL Lv  1 Current             No pap smear on file d/t age.  Plan to perform after 19 years of age.   Past Medical History:  Diagnosis Date  . Medical history non-contributory    Past Surgical History:  Procedure Laterality Date  . NO PAST SURGERIES     Family History  Problem Relation Age of Onset  . Healthy Mother     . Healthy Father    Social History   Tobacco Use  . Smoking status: Never Smoker  . Smokeless tobacco: Never Used  Vaping Use  . Vaping Use: Never used  Substance Use Topics  . Alcohol use: Not Currently  . Drug use: Not Currently   No Known Allergies Current Outpatient Medications on File Prior to Visit  Medication Sig Dispense Refill  . Prenatal Vit-Fe Phos-FA-Omega (VITAFOL GUMMIES) 3.33-0.333-34.8 MG CHEW Chew 3 each by mouth daily. 90 tablet 12  . Blood Pressure Monitoring (BLOOD PRESSURE MONITOR AUTOMAT) DEVI 1 Device by Does not apply route daily. Automatic blood pressure cuff regular size. To monitor blood pressure regularly at home. ICD-10 code:Z34.90 1 each 0  . Misc. Devices (GOJJI WEIGHT SCALE) MISC 1 Device by Does not apply route daily as needed. To weight self daily as needed at home. ICD-10 code: Z34.90 1 each 0  . ondansetron (ZOFRAN-ODT) 4 MG disintegrating tablet Take 1 tablet (4 mg total) by mouth every 8 (eight) hours as needed for nausea or vomiting. 15 tablet 0   No current facility-administered medications on file prior to visit.    Review of Systems Pertinent items noted in HPI and remainder of comprehensive ROS otherwise negative.  Exam   Vitals:   08/27/20 0916  BP: 137/75  Pulse: (!) 106  Temp: 98.3 F (36.8 C)  Weight: 189 lb (85.7 kg)  Physical Exam Constitutional:      Appearance: Normal appearance. She is normal weight.  Genitourinary:     No vulval lesion or ulcerations noted.     Vaginal discharge and rugosity present.     No vaginal tenderness, bleeding or ulceration.     Cervical discharge present.     No cervical friability, erythema, bleeding, polyp or nabothian cyst.     Uterus is enlarged.     Genitourinary Comments: Speculum Exam: NEFG.  Moderate amt thin gray discharge. Apparent Malodor. Cervix with moderate amt yellowish white discharge. CV collected BME without concern. Cervix Closed.   HENT:     Head:  Normocephalic and atraumatic.  Eyes:     Conjunctiva/sclera: Conjunctivae normal.  Neck:     Thyroid: No thyroid mass, thyromegaly or thyroid tenderness.  Cardiovascular:     Rate and Rhythm: Normal rate and regular rhythm.     Heart sounds: Normal heart sounds.  Pulmonary:     Effort: Pulmonary effort is normal. No respiratory distress.     Breath sounds: Normal breath sounds.  Chest:     Breasts:        Right: No mass, nipple discharge, skin change or tenderness.        Left: No mass, nipple discharge, skin change or tenderness.     Comments: CBE Complete Abdominal:     General: Bowel sounds are normal.     Tenderness: There is no abdominal tenderness.     Comments: FH at 29cm  Musculoskeletal:     Cervical back: Normal range of motion. No rigidity.  Neurological:     Mental Status: She is alert and oriented to person, place, and time.  Psychiatric:        Mood and Affect: Mood normal.        Behavior: Behavior normal.        Thought Content: Thought content normal.     Assessment:   19 y.o. year old G1P0 Patient Active Problem List   Diagnosis Date Noted  . Supervision of normal first pregnancy, antepartum 04/12/2020     Plan:  1. Supervision of normal first pregnancy, antepartum -Congratulations given and patient welcomed to practice. -Anticipatory guidance for prenatal visits including labs, ultrasounds, and testing; Initial labs drawn. -Genetic Screening discussed, Harmony and Panorama: ordered. -Encouraged to complete MyChart Registration for her ability to review results, send requests, and have questions addressed.  -Discussed estimated due date of Nov 17, 2020. -Ultrasound discussed; fetal anatomic survey: ordered. -Initiate prenatal vitamins; Rx sent to pharmacy on file.  -Influenza offered and accepted. -Reviewed covid vaccine and recommendation.  Patient "scared and not interested."  -Encouraged to seek out care at office or emergency room for urgent  and/or emergent concerns. -Educated on the nature of [place] [facility] with multiple MDs and other Advanced Practice Providers was explained to patient; also emphasized that residents, students are part of our team. Informed of her right to refuse care as she deems appropriate.  -No questions or concerns.   2. Late prenatal care, antepartum -Will follow closely. -Plan for all visits in person.  3. Generalized anxiety disorder -PHQ 10, GAD 12 -Discussed referral for BHS. Patient agreeable -Referral ordered.   4. [redacted] weeks gestation of pregnancy -Reviewed need for GTT next week. -Reviewed Glucola appt preparation including fasting the night before and morning of.   -Discussed anticipated office time of 2.5-3 hours.  -Reviewed blood draw procedures and labs which also include check  of iron level.  -Discussed how results of GTT are handled including diabetic education and BS testing for abnormal results and routine care for normal results.  -Plan for influenza vaccine today and Tdap with GTT.    Problem list reviewed and updated. Routine obstetric precautions reviewed.  Orders Placed This Encounter  Procedures  . Culture, OB Urine  . CBC/D/Plt+RPR+Rh+ABO+Rub Ab...  . Hemoglobin A1c  . Genetic Screening    No follow-ups on file.   Cherre Robins, CNM 08/27/2020 9:55 AM

## 2020-08-27 NOTE — Patient Instructions (Signed)

## 2020-08-28 LAB — CBC/D/PLT+RPR+RH+ABO+RUB AB...
Antibody Screen: NEGATIVE
Basophils Absolute: 0.1 10*3/uL (ref 0.0–0.2)
Basos: 1 %
EOS (ABSOLUTE): 0.2 10*3/uL (ref 0.0–0.4)
Eos: 2 %
HCV Ab: <0.1 s/co ratio (ref 0.0–0.9)
HIV Screen 4th Generation wRfx: NONREACTIVE
Hematocrit: 30.6 % — ABNORMAL LOW (ref 34.0–46.6)
Hemoglobin: 10 g/dL — ABNORMAL LOW (ref 11.1–15.9)
Hepatitis B Surface Ag: NEGATIVE
Immature Grans (Abs): 0.1 10*3/uL (ref 0.0–0.1)
Immature Granulocytes: 1 %
Lymphocytes Absolute: 2.1 10*3/uL (ref 0.7–3.1)
Lymphs: 23 %
MCH: 26.8 pg (ref 26.6–33.0)
MCHC: 32.7 g/dL (ref 31.5–35.7)
MCV: 82 fL (ref 79–97)
Monocytes Absolute: 0.7 10*3/uL (ref 0.1–0.9)
Monocytes: 8 %
Neutrophils Absolute: 6 10*3/uL (ref 1.4–7.0)
Neutrophils: 65 %
Platelets: 259 10*3/uL (ref 150–450)
RBC: 3.73 x10E6/uL — ABNORMAL LOW (ref 3.77–5.28)
RDW: 12 % (ref 11.7–15.4)
RPR Ser Ql: NONREACTIVE
Rh Factor: POSITIVE
Rubella Antibodies, IGG: 7.81 index (ref >0.99)
WBC: 9.2 10*3/uL (ref 3.4–10.8)

## 2020-08-28 LAB — HEMOGLOBIN A1C
Est. average glucose Bld gHb Est-mCnc: 91 mg/dL
Hgb A1c MFr Bld: 4.8 % (ref 4.8–5.6)

## 2020-08-28 LAB — HCV INTERPRETATION

## 2020-08-29 DIAGNOSIS — O99013 Anemia complicating pregnancy, third trimester: Secondary | ICD-10-CM | POA: Insufficient documentation

## 2020-08-29 DIAGNOSIS — O99012 Anemia complicating pregnancy, second trimester: Secondary | ICD-10-CM | POA: Insufficient documentation

## 2020-08-29 LAB — CULTURE, OB URINE

## 2020-08-29 LAB — URINE CULTURE, OB REFLEX

## 2020-08-29 MED ORDER — FERROUS SULFATE 325 (65 FE) MG PO TBEC: 325.0000 mg | DELAYED_RELEASE_TABLET | ORAL | 3 refills | Status: DC

## 2020-08-29 NOTE — Addendum Note (Signed)
Addended by: Gerrit Heck L on: 08/29/2020 04:59 AM   Modules accepted: Orders

## 2020-08-31 ENCOUNTER — Encounter: Payer: Self-pay | Admitting: General Practice

## 2020-08-31 ENCOUNTER — Other Ambulatory Visit: Payer: Medicaid Other

## 2020-08-31 LAB — CERVICOVAGINAL ANCILLARY ONLY
Chlamydia: NEGATIVE
Comment: NEGATIVE
Comment: NEGATIVE
Comment: NORMAL
Neisseria Gonorrhea: NEGATIVE
Trichomonas: NEGATIVE

## 2020-09-01 ENCOUNTER — Ambulatory Visit (INDEPENDENT_AMBULATORY_CARE_PROVIDER_SITE_OTHER): Payer: Medicaid Other | Admitting: Licensed Clinical Social Worker

## 2020-09-01 ENCOUNTER — Other Ambulatory Visit (INDEPENDENT_AMBULATORY_CARE_PROVIDER_SITE_OTHER): Payer: Medicaid Other | Admitting: *Deleted

## 2020-09-01 ENCOUNTER — Other Ambulatory Visit: Payer: Self-pay

## 2020-09-01 DIAGNOSIS — Z23 Encounter for immunization: Secondary | ICD-10-CM | POA: Diagnosis not present

## 2020-09-01 DIAGNOSIS — Z3400 Encounter for supervision of normal first pregnancy, unspecified trimester: Secondary | ICD-10-CM

## 2020-09-01 DIAGNOSIS — F4322 Adjustment disorder with anxiety: Secondary | ICD-10-CM

## 2020-09-01 NOTE — Progress Notes (Signed)
   Patient in clinic for 2 hour gtt today. Tdap given today.  Clovis Pu, RN

## 2020-09-02 ENCOUNTER — Other Ambulatory Visit: Payer: Self-pay

## 2020-09-02 ENCOUNTER — Encounter: Payer: Self-pay | Admitting: General Practice

## 2020-09-02 ENCOUNTER — Telehealth: Payer: Self-pay | Admitting: *Deleted

## 2020-09-02 DIAGNOSIS — O24410 Gestational diabetes mellitus in pregnancy, diet controlled: Secondary | ICD-10-CM

## 2020-09-02 DIAGNOSIS — Z3400 Encounter for supervision of normal first pregnancy, unspecified trimester: Secondary | ICD-10-CM

## 2020-09-02 LAB — GLUCOSE TOLERANCE, 2 HOURS W/ 1HR
Glucose, 1 hour: 96 mg/dL (ref 65–179)
Glucose, 2 hour: 118 mg/dL (ref 65–152)
Glucose, Fasting: 93 mg/dL — ABNORMAL HIGH (ref 65–91)

## 2020-09-02 MED ORDER — ACCU-CHEK SOFTCLIX LANCETS MISC: 1.0000 | Freq: Four times a day (QID) | 12 refills | Status: DC

## 2020-09-02 MED ORDER — ACCU-CHEK GUIDE ME W/DEVICE KIT: 1.0000 | PACK | Freq: Four times a day (QID) | 0 refills | Status: DC

## 2020-09-02 MED ORDER — ACCU-CHEK GUIDE VI STRP: 1.0000 | ORAL_STRIP | Freq: Four times a day (QID) | 12 refills | Status: DC

## 2020-09-02 NOTE — Telephone Encounter (Signed)
-----   Message from Gerrit Heck, [place] sent at 09/02/2020  1:11 PM EST ----- Please send script for glucometer and supplies.  I have placed the referral for diabetic education. Thanks!

## 2020-09-06 ENCOUNTER — Encounter: Payer: Self-pay | Admitting: General Practice

## 2020-09-06 NOTE — BH Specialist Note (Signed)
Integrated Behavioral Health Initial In-Person Visit  MRN: [id number] Name: Paula Nunez  Number of Integrated Behavioral Health Clinician visits:: 1/6 Session Start time: 9:02am  Session End time: 9:40am Total time: 38 minutes in person at [facility]   Types of Service: General Behavioral Integrated Care (BHI)  Interpretor:No. Interpretor Name and Language: None    Warm Hand Off Completed.       Subjective: Paula Nunez is a 19 y.o. female accompanied by n/a Patient was referred by Darcella Cheshire RN  for elevated phq9. Patient reports the following symptoms/concerns: anxious mood, new pregnancy  Duration of problem: 4 weeks ; Severity of problem: mild  Objective: Mood: NA and Affect: Appropriate Risk of harm to self or others: No plan to harm self or others  Life Context: Family and Social: Lives with Grandmother  School/Work: Graduated HS  Self-Care: n/a Life Changes: New pregnancy   Patient and/or Family's Strengths/Protective Factors: Concrete supports in place (healthy food, safe environments, etc.)  Goals Addressed: Patient will: 1. Reduce symptoms of: anxiety 2. Increase knowledge and/or ability of: stress reduction  3. Demonstrate ability to: Increase healthy adjustment to current life circumstances  Progress towards Goals: Ongoing  Interventions: Interventions utilized: Mindfulness or Relaxation Training  Standardized Assessments completed: PHQ 9  Assessment: Patient currently experiencing adjustment disorder with anxious mood    Patient may benefit from integrated behavioral health   Plan: 1. Follow up with behavioral health clinician on : 09/23/2020 2. Behavioral recommendations: Engage in community supports such as nurse family partnership, infant care class and healthy start  3. Referral(s): nurse family partnership  4. "From scale of 1-10, how likely are you to follow plan?": 10  Gwyndolyn Saxon, LCSW

## 2020-09-08 ENCOUNTER — Other Ambulatory Visit: Payer: Self-pay

## 2020-09-08 ENCOUNTER — Encounter: Payer: Medicaid Other | Admitting: Registered"

## 2020-09-08 ENCOUNTER — Encounter: Payer: Self-pay | Admitting: Registered"

## 2020-09-08 DIAGNOSIS — O24419 Gestational diabetes mellitus in pregnancy, unspecified control: Secondary | ICD-10-CM | POA: Diagnosis not present

## 2020-09-08 HISTORY — DX: Gestational diabetes mellitus in pregnancy, unspecified control: O24.419

## 2020-09-08 NOTE — Progress Notes (Signed)
Patient was seen on 09/08/20 for Gestational Diabetes self-management class at [facility]. The following learning objectives were met by the patient during this course:   States the definition of Gestational Diabetes  States why dietary management is important in controlling blood glucose  Describes the effects each nutrient has on blood glucose levels  Demonstrates ability to create a balanced meal plan  Demonstrates carbohydrate counting   States when to check blood glucose levels  Demonstrates proper blood glucose monitoring techniques  States the effect of stress and exercise on blood glucose levels  States the importance of limiting caffeine and abstaining from alcohol and smoking  Blood glucose monitor given: Accu-chek Guide Me Lot #[id number] Exp: 10/25/2021 CBG: 84 mg/dL  Patient instructed to monitor glucose levels: FBS: 60 - <95; 1 hour: <140; 2 hour: <120  Patient received handouts:  Nutrition Diabetes and Pregnancy, including carb counting list  Patient will be seen for follow-up as needed.

## 2020-09-09 ENCOUNTER — Other Ambulatory Visit: Payer: Self-pay

## 2020-09-09 ENCOUNTER — Other Ambulatory Visit: Payer: Self-pay | Admitting: *Deleted

## 2020-09-09 ENCOUNTER — Ambulatory Visit: Payer: Medicaid Other

## 2020-09-09 DIAGNOSIS — Z3400 Encounter for supervision of normal first pregnancy, unspecified trimester: Secondary | ICD-10-CM | POA: Diagnosis not present

## 2020-09-09 DIAGNOSIS — O0930 Supervision of pregnancy with insufficient antenatal care, unspecified trimester: Secondary | ICD-10-CM

## 2020-09-09 DIAGNOSIS — O24419 Gestational diabetes mellitus in pregnancy, unspecified control: Secondary | ICD-10-CM

## 2020-09-11 DIAGNOSIS — D563 Thalassemia minor: Secondary | ICD-10-CM | POA: Insufficient documentation

## 2020-09-11 DIAGNOSIS — E7601 Hurler's syndrome: Secondary | ICD-10-CM | POA: Insufficient documentation

## 2020-09-20 ENCOUNTER — Encounter (HOSPITAL_COMMUNITY): Payer: Self-pay | Admitting: Emergency Medicine

## 2020-09-20 ENCOUNTER — Ambulatory Visit (HOSPITAL_COMMUNITY)
Admission: EM | Admit: 2020-09-20 | Discharge: 2020-09-20 | Disposition: A | Discharge status: Home / Self Care | Payer: Medicaid Other | Attending: Family Medicine | Admitting: Family Medicine

## 2020-09-20 ENCOUNTER — Telehealth (HOSPITAL_COMMUNITY): Payer: Self-pay | Admitting: Family Medicine

## 2020-09-20 ENCOUNTER — Other Ambulatory Visit: Payer: Self-pay

## 2020-09-20 DIAGNOSIS — O26893 Other specified pregnancy related conditions, third trimester: Secondary | ICD-10-CM | POA: Diagnosis not present

## 2020-09-20 DIAGNOSIS — N898 Other specified noninflammatory disorders of vagina: Secondary | ICD-10-CM

## 2020-09-20 DIAGNOSIS — Z3A31 31 weeks gestation of pregnancy: Secondary | ICD-10-CM | POA: Insufficient documentation

## 2020-09-20 DIAGNOSIS — O24419 Gestational diabetes mellitus in pregnancy, unspecified control: Secondary | ICD-10-CM

## 2020-09-20 DIAGNOSIS — O26899 Other specified pregnancy related conditions, unspecified trimester: Secondary | ICD-10-CM

## 2020-09-20 MED ORDER — CLOTRIMAZOLE 1 % VA CREA: 1.0000 | TOPICAL_CREAM | Freq: Every day | VAGINAL | 0 refills | Status: DC

## 2020-09-20 MED ORDER — TERCONAZOLE 0.8 % VA CREA: 1.0000 | TOPICAL_CREAM | Freq: Every day | VAGINAL | 0 refills | Status: DC

## 2020-09-20 NOTE — Telephone Encounter (Signed)
Terconazole not covered Will order clotrimazole

## 2020-09-20 NOTE — ED Provider Notes (Signed)
[facility]    CSN: [id number] Arrival date & time: 09/20/20  1916      History   Chief Complaint Chief Complaint  Patient presents with   Vaginal Discharge    HPI Paula Nunez is a 19 y.o. female.   HPI  Patient is here for vaginal discharge.  She has mild vaginal odor.  Vaginal itching.  She is noted to be [redacted] weeks pregnant.  Patient states pregnancy complicated by gestational diabetes.  Does not know what her most recent sugar is.  Would like to be tested for STI.  No abdominal pain.  No fever.  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 Authorizing Provider  ondansetron (ZOFRAN-ODT) 4 MG disintegrating tablet Take 1 tablet (4 mg total) by mouth every 8 (eight) hours as needed for nausea or vomiting. 07/13/20  Yes Vanessa Kick, MD  Prenatal Vit-Fe Phos-FA-Omega (VITAFOL GUMMIES) 3.33-0.333-34.8 MG CHEW Chew 3 each by mouth daily. 04/12/20  Yes Gavin Pound, CNM  terconazole (TERAZOL 3) 0.8 % vaginal cream Place 1 applicator vaginally at bedtime. 09/20/20  Yes Raylene Everts, MD  Accu-Chek Softclix Lancets lancets 1 each by Other route in the morning, at noon, in the evening, and at bedtime. Use as instructed 09/02/20   Gavin Pound, CNM  Blood Glucose Monitoring Suppl (ACCU-CHEK GUIDE ME) w/Device KIT 1 Device by Does not apply route in the morning, at noon, in the evening, and at bedtime. 09/02/20   Gavin Pound, CNM  Blood Pressure Monitoring (BLOOD PRESSURE MONITOR AUTOMAT) DEVI 1 Device by Does not apply route daily. Automatic blood pressure cuff regular size. To monitor blood pressure regularly at home. ICD-10 code:Z34.90 04/12/20   Gavin Pound, CNM  ferrous sulfate 325 (65 FE) MG EC tablet Take 1 tablet (325 mg total) by mouth every other day. 08/29/20   Gavin Pound, CNM  glucose blood (ACCU-CHEK GUIDE) test strip 1 each by Other route in the morning, at noon, in the evening, and at bedtime. Use as instructed 09/02/20   Gavin Pound, [place]. Devices (GOJJI WEIGHT SCALE) MISC 1 Device by Does not apply route daily as needed. To weight self daily as needed at home. ICD-10 code: Z34.90 04/12/20   Gavin Pound, CNM    Family History Family History  Problem Relation Age of Onset   Healthy Mother    Healthy Father     Social History Social History   Tobacco Use  Smoking status: Never Smoker   Smokeless tobacco: Never Used  Vaping Use   Vaping Use: Never used  Substance Use Topics   Alcohol use: Not Currently   Drug use: Not Currently     Allergies   Patient has no known allergies.   Review of Systems Review of Systems See HPI  Physical Exam Triage Vital Signs ED Triage Vitals  Enc Vitals Group     BP 09/20/20 1011 128/66     Pulse Rate 09/20/20 1011 (!) 105     Resp 09/20/20 1011 20     Temp 09/20/20 1011 97.7 F (36.5 C)     Temp Source 09/20/20 1011 Oral     SpO2 09/20/20 1011 96 %     Weight --      Height --      Head Circumference --      Peak Flow --      Pain Score 09/20/20 1009 0     Pain Loc --      Pain Edu? --      Excl. in [place]? --    No data found.  Updated Vital Signs BP 128/66 (BP Location: Left Arm)    Pulse (!) 105    Temp 97.7 F (36.5 C) (Oral)     Resp 20    LMP 02/11/2020 (Approximate)    SpO2 96%      Physical Exam Constitutional:      General: She is not in acute distress.    Appearance: She is well-developed and well-nourished.     Comments: Gravid  HENT:     Head: Normocephalic and atraumatic.     Mouth/Throat:     Mouth: Oropharynx is clear and moist.  Eyes:     Conjunctiva/sclera: Conjunctivae normal.     Pupils: Pupils are equal, round, and reactive to light.  Cardiovascular:     Rate and Rhythm: Normal rate.  Pulmonary:     Effort: Pulmonary effort is normal. No respiratory distress.  Abdominal:     General: There is no distension.     Palpations: Abdomen is soft.  Genitourinary:    Comments: Self swab is described to patient Musculoskeletal:        General: No edema. Normal range of motion.     Cervical back: Normal range of motion.  Skin:    General: Skin is warm and dry.  Neurological:     Mental Status: She is alert.  Psychiatric:        Mood and Affect: Mood normal.        Behavior: Behavior normal.      UC Treatments / Results  Labs (all labs ordered are listed, but only abnormal results are displayed) Labs Reviewed  CERVICOVAGINAL ANCILLARY ONLY    EKG   Radiology No results found.  Procedures Procedures (including critical care time)  Medications Ordered in UC Medications - No data to display  Initial Impression / Assessment and Plan / UC Course  I have reviewed the triage vital signs and the nursing notes.  Pertinent labs & imaging results that were available during my care of the patient were reviewed by me and considered in my medical decision making (see chart for details).     We will treat for yeast infection since this is likely with gestational diabetes in pregnancy.  Will wait for swab results for any additional medication Final Clinical Impressions(s) / UC Diagnoses   Final diagnoses:  [redacted] weeks gestation of pregnancy  Vaginal  discharge during pregnancy,  antepartum  Gestational diabetes mellitus (GDM) in second trimester, gestational diabetes method of control unspecified     Discharge Instructions     Use vaginal cream Check My Chart for test results You will be called if any of your test results are positive   ED Prescriptions    Medication Sig Dispense Auth. Provider   terconazole (TERAZOL 3) 0.8 % vaginal cream Place 1 applicator vaginally at bedtime. 20 g Raylene Everts, MD     PDMP not reviewed this encounter.   Raylene Everts, MD 09/20/20 1120

## 2020-09-20 NOTE — ED Triage Notes (Signed)
Pt states that her sx started three days ago with vaginal discharge, itching, and vaginal odor.

## 2020-09-20 NOTE — Discharge Instructions (Addendum)
Use vaginal cream Check My Chart for test results You will be called if any of your test results are positive

## 2020-09-21 ENCOUNTER — Telehealth (HOSPITAL_COMMUNITY): Payer: Self-pay | Admitting: Emergency Medicine

## 2020-09-21 LAB — CERVICOVAGINAL ANCILLARY ONLY
Bacterial Vaginitis (gardnerella): POSITIVE — AB
Candida Glabrata: NEGATIVE
Candida Vaginitis: POSITIVE — AB
Chlamydia: NEGATIVE
Comment: NEGATIVE
Comment: NEGATIVE
Comment: NEGATIVE
Comment: NEGATIVE
Comment: NEGATIVE
Comment: NORMAL
Neisseria Gonorrhea: NEGATIVE
Trichomonas: POSITIVE — AB

## 2020-09-21 MED ORDER — METRONIDAZOLE 0.75 % VA GEL: 1.0000 | Freq: Every day | VAGINAL | 0 refills | Status: DC

## 2020-09-21 MED ORDER — METRONIDAZOLE 500 MG PO TABS: 2000.0000 mg | ORAL_TABLET | Freq: Once | ORAL | 0 refills | Status: AC

## 2020-09-23 ENCOUNTER — Ambulatory Visit (INDEPENDENT_AMBULATORY_CARE_PROVIDER_SITE_OTHER): Payer: BLUE CROSS/BLUE SHIELD | Admitting: Certified Nurse Midwife

## 2020-09-23 ENCOUNTER — Other Ambulatory Visit: Payer: Self-pay

## 2020-09-23 VITALS — BP 127/65 | HR 103 | Temp 98.4°F | Wt 192.8 lb

## 2020-09-23 DIAGNOSIS — Z3A32 32 weeks gestation of pregnancy: Secondary | ICD-10-CM

## 2020-09-23 DIAGNOSIS — A599 Trichomoniasis, unspecified: Secondary | ICD-10-CM

## 2020-09-23 DIAGNOSIS — O24410 Gestational diabetes mellitus in pregnancy, diet controlled: Secondary | ICD-10-CM

## 2020-09-23 DIAGNOSIS — N760 Acute vaginitis: Secondary | ICD-10-CM

## 2020-09-23 DIAGNOSIS — B9689 Other specified bacterial agents as the cause of diseases classified elsewhere: Secondary | ICD-10-CM

## 2020-09-23 DIAGNOSIS — Z3403 Encounter for supervision of normal first pregnancy, third trimester: Secondary | ICD-10-CM

## 2020-09-23 MED ORDER — ACCU-CHEK GUIDE VI STRP: 1.0000 | ORAL_STRIP | Freq: Four times a day (QID) | 12 refills | Status: DC

## 2020-09-23 MED ORDER — ACCU-CHEK GUIDE ME W/DEVICE KIT: 1.0000 | PACK | Freq: Four times a day (QID) | 0 refills | Status: DC

## 2020-09-23 MED ORDER — ONDANSETRON 4 MG PO TBDP: 4.0000 mg | ORAL_TABLET | Freq: Three times a day (TID) | ORAL | 0 refills | Status: DC | PRN

## 2020-09-23 MED ORDER — ACCU-CHEK SOFTCLIX LANCETS MISC: 1.0000 | Freq: Four times a day (QID) | 12 refills | Status: DC

## 2020-09-23 MED ORDER — METRONIDAZOLE 500 MG PO TABS: 500.0000 mg | ORAL_TABLET | Freq: Two times a day (BID) | ORAL | 0 refills | Status: DC

## 2020-09-23 NOTE — Progress Notes (Signed)
PRENATAL VISIT NOTE  Subjective:  Paula Nunez is a 19 y.o. G1P0 at 73w1dbeing seen today for ongoing prenatal care.  She is currently monitored for the following issues for this low-risk pregnancy and has Supervision of normal first pregnancy, antepartum; Anemia in pregnancy, second trimester; Gestational diabetes mellitus (GDM), antepartum; Hurler syndrome [place]; and Alpha thalassemia silent carrier on their problem list.  Patient reports trip to Urgent Care for off-color and malodorous discharge. She was diagnosed with BV, yeast, and trich but improperly treated and did not keep down the medication.  Contractions: Irritability. Vag. Bleeding: None.  Movement: Present. Denies leaking of fluid.   The following portions of the patient's history were reviewed and updated as appropriate: allergies, current medications, past family history, past medical history, past social history, past surgical history and problem list.   Objective:   Vitals:   09/23/20 1031  BP: 127/65  Pulse: (!) 103  Temp: 98.4 F (36.9 C)  Weight: 192 lb 12.8 oz (87.5 kg)    Fetal Status: Fetal Heart Rate (bpm): 142 Fundal Height: 32 cm Movement: Present     General:  Alert, oriented and cooperative. Patient is in no acute distress.  Skin: Skin is warm and dry. No rash noted.   Cardiovascular: Normal heart rate noted  Respiratory: Normal respiratory effort, no problems with respiration noted  Abdomen: Soft, gravid, appropriate for gestational age.  Pain/Pressure: Absent     Pelvic: Cervical exam deferred        Extremities: Normal range of motion.  Edema: Trace  Mental Status: Normal mood and affect. Normal behavior. Normal judgment and thought content.   Assessment and Plan:  Pregnancy: G1P0 at 314w1d. Encounter for supervision of normal first pregnancy in third trimester at 32 weeks - Pt doing well aside from STI  2. Diet controlled gestational diabetes mellitus (GDM), antepartum - Has been to the  nutritionist but has not gotten her testing supplies as they were sent to a different pharmacy. RN sent to correct pharmacy. - Pt has adjusted her eating, encouraged her to pick up supplies and begin testing so she can be sure her dietary changes are controlling her glucose. Pt verbalized understanding and agreed to pick up her supplies today. - Accu-Chek Softclix Lancets lancets; 1 each by Other route in the morning, at noon, in the evening, and at bedtime. Use as instructed  Dispense: 100 each; Refill: 12 - Blood Glucose Monitoring Suppl (ACCU-CHEK GUIDE ME) w/Device KIT; 1 Device by Does not apply route in the morning, at noon, in the evening, and at bedtime.  Dispense: 1 kit; Refill: 0 - glucose blood (ACCU-CHEK GUIDE) test strip; 1 each by Other route in the morning, at noon, in the evening, and at bedtime. Use as instructed  Dispense: 100 each; Refill: 12  4. BV (bacterial vaginosis) - ondansetron (ZOFRAN-ODT) 4 MG disintegrating tablet; Take 1 tablet (4 mg total) by mouth every 8 (eight) hours as needed for nausea or vomiting.  Dispense: 15 tablet; Refill: 0 - metroNIDAZOLE (FLAGYL) 500 MG tablet; Take 1 tablet (500 mg total) by mouth 2 (two) times daily.  Dispense: 14 tablet; Refill: 0  5. Trichomoniasis - ondansetron (ZOFRAN-ODT) 4 MG disintegrating tablet; Take 1 tablet (4 mg total) by mouth every 8 (eight) hours as needed for nausea or vomiting.  Dispense: 15 tablet; Refill: 0 - metroNIDAZOLE (FLAGYL) 500 MG tablet; Take 1 tablet (500 mg total) by mouth 2 (two) times daily.  Dispense: 14 tablet; Refill: 0  Preterm labor symptoms and general obstetric precautions including but not limited to vaginal bleeding, contractions, leaking of fluid and fetal movement were reviewed in detail with the patient. Please refer to After Visit Summary for other counseling recommendations.   No follow-ups on file.  Future Appointments  Date Time Provider Vici  10/06/2020  1:30 PM Gavin Pound, CNM [facility] None  10/07/2020  3:00 PM [facility] NURSE [facility]  10/07/2020  3:15 PM [facility] US2 [facility]  10/22/2020  9:30 AM Gavin Pound, CNM [facility] None  10/29/2020  9:30 AM Gavin Pound, CNM [facility] None  11/05/2020  9:30 AM Nugent, Gerrie Nordmann, NP [facility] None  11/12/2020  9:30 AM Gavin Pound, [place] None    Gabriel Carina, CNM

## 2020-09-25 NOTE — L&D Delivery Note (Signed)
[facility] Delivery Note  Paula Nunez is a 20 y.o. G1P0 s/p VD at [redacted]w[redacted]d. She was admitted for IOL-A1GDM.   ROM: 4h 48m with clear fluid GBS Status: positive, treated with PCN Maximum Maternal Temperature: 98.4  Labor Progress: Patient progressed well with Cytotec, FB, SROM, and titration of Pitocin.    Delivery Date/Time: 11/09/20, 1428 Delivery: Called to room and patient was complete and pushing. Head delivered LOA. No nuchal cord present. Shoulder and body delivered in usual fashion. Infant with spontaneous cry, placed on mother's abdomen, dried and stimulated. Cord clamped x 2 after 1-minute delay, and cut by friend under my direct supervision. Cord blood drawn. Placenta delivered spontaneously with gentle cord traction. Fundus firm with massage and Pitocin. Labia, perineum, vagina, and cervix were inspected, with a 1st degree perineal laceration that was repaired with a 3.0 monocryl on a CT1.   Placenta: complete, three vessel cord appreciated Complications: none  Lacerations: 1st degree perineal as noted above.  EBL: 200 mL Analgesia: epidural   Infant: viable female  APGARs 9,10  weight pending   Franchot Erichsen, DO [facility] [employer]

## 2020-10-06 ENCOUNTER — Ambulatory Visit (INDEPENDENT_AMBULATORY_CARE_PROVIDER_SITE_OTHER): Payer: Medicaid Other

## 2020-10-06 ENCOUNTER — Other Ambulatory Visit (HOSPITAL_COMMUNITY)
Admission: RE | Admit: 2020-10-06 | Discharge: 2020-10-06 | Disposition: A | Discharge status: Home / Self Care | Payer: Medicaid Other | Source: Ambulatory Visit

## 2020-10-06 ENCOUNTER — Other Ambulatory Visit: Payer: Self-pay

## 2020-10-06 VITALS — BP 120/67 | HR 106 | Temp 97.6°F | Wt 197.2 lb

## 2020-10-06 DIAGNOSIS — N898 Other specified noninflammatory disorders of vagina: Secondary | ICD-10-CM

## 2020-10-06 DIAGNOSIS — Z3400 Encounter for supervision of normal first pregnancy, unspecified trimester: Secondary | ICD-10-CM | POA: Insufficient documentation

## 2020-10-06 DIAGNOSIS — B9689 Other specified bacterial agents as the cause of diseases classified elsewhere: Secondary | ICD-10-CM

## 2020-10-06 DIAGNOSIS — A599 Trichomoniasis, unspecified: Secondary | ICD-10-CM

## 2020-10-06 DIAGNOSIS — O24410 Gestational diabetes mellitus in pregnancy, diet controlled: Secondary | ICD-10-CM

## 2020-10-06 DIAGNOSIS — N760 Acute vaginitis: Secondary | ICD-10-CM

## 2020-10-06 NOTE — Patient Instructions (Signed)
AREA PEDIATRIC/FAMILY PRACTICE PHYSICIANS  [place] ([address]) . [facility] o Chambliss, MD; Eniola, MD; Hale, MD; Hensel, MD; McDiarmid, MD; McIntyer, MD; Neal, MD; Walden, MD o [address] [place], [place] [address] o [phone] o Mon-Fri 8:30-12:30, 1:30-5:00 o Providers come to see babies at [facility] o Accepting Medicaid . [facility] o Limited providers who accept newborns: Koirala, MD; Morrow, MD; Wolters, MD o [address], [place], [place] [address] o [phone] o Mon-Fri 8:00-5:30 o Babies seen by providers at [facility] o Does NOT accept Medicaid o Please call early in hospitalization for appointment (limited availability)  . [facility] o Mulberry, MD o [address] [place], [place] [address] o [phone] o Mon, Tue, Thur, Fri 8:30-5:00, Wed 10:00-7:00 (closed 1-2pm) o Babies seen by [facility] providers o Accepting Medicaid . [facility] o Rubin, MD o [address], [place], [place] [address] o [phone] o Mon-Fri 8:30-5:00, Sat 8:30-12:00 o Provider comes to see babies at [facility] o Accepting Medicaid o Must have been referred from current patients or contacted office prior to delivery . [facility] ([facility]) o Brown, MD; Chandler, MD; Ettefagh, MD; Grant, MD; Lester, MD; McCormick, MD; McQueen, MD; Prose, MD; Simha, MD; Stanley, MD; Stryffeler, NP; Tebben, NP o [address], [place], [place] [address] o [phone] o Mon, Tue, Thur, Fri 8:30-5:30, Wed 9:30-5:30, Sat 8:30-12:30 o Babies seen by [facility] providers o Accepting Medicaid o Only accepting infants of first-time parents or siblings of current patients o Hospital discharge coordinator will make follow-up appointment . Jack Amos o [address],  [place], [place]  [address] o [phone]   Fax - [phone] . [facility] o [address], [place], [place]  [address] o Phone - [phone]   Fax - [phone] . Shilpa Gosrani o [address], [place], [place]  [address] o [phone]  [place] ([address]) . [facility] o Bates, MD; Brassfield, MD; Cooper, Cox, MD; MD; Davis, MD; Dovico, MD; Ettefaugh, MD; Little, MD; Lowe, MD; Keiffer, MD; Melvin, MD; Sumner, MD; Williams, MD o [address], [place], [place] [address] o [phone] o Mon-Fri 8:30-5:00 (extended evenings Mon-Thur as needed), Sat-Sun 10:00-1:00 o Providers come to see babies at [facility] o Accepting Medicaid for families of first-time babies and families with all children in the household age 3 and under. Must register with office prior to making appointment (M-F only). . [facility] o Henson, NP; Knapp, MD; Lalonde, MD; Tysinger, PA o [address] [place], [place] [address] o [phone] o Mon-Fri 8:00-5:00 o Babies seen by providers at [facility] o Does NOT accept Medicaid/Commercial Insurance Only . [facility] ([facility]  o Artis, MD; Barnes, MD; Bratton, MD; Coccaro, MD; Lockett Gardner, MD; Kramer, MD; Marshall, MD; Netherton, MD; Poleto, MD; Skinner, MD o [address] [place], [place] [address] o [phone] o Mon-Fri 8:30-5:30, Sat (Oct.-Mar.) 9:00-1:00 o Babies seen by providers at [facility] o Accepting Medicaid  [place] ([address]) . [facility]  o Reid, MD; Warner, MD o [address], , [place] [address] o [phone] o Mon-Fri 8:30-5:00, Sat 8:30-12:00 o Providers come to see babies at [facility] o Does NOT accept Medicaid . [facility] o Becker, PA; Hagler, MD; Scifres, PA; Sun, MD; Swayne, MD o [address],  [place], [place] [address] o [phone] o Mon-Fri 8:00-5:00 o Babies seen by providers at [facility] o Does NOT accept Medicaid o Only accepting babies of parents who are patients o Please call early in hospitalization for appointment (limited availability) . [facility] o Clark, MD; Frye, MD; Kelleher, MD; Mack, NP; Miller, MD; O'Keller, MD; Patterson, NP; Pudlo, MD; Puzio, MD; Thomas, MD; Tucker, MD; Twiselton, MD o [address], [place], [place] [address] o [phone] o Mon-Fri 8:00-5:00, Sat 9:00-12:00 o Providers come to see babies at [facility] o Does NOT accept Medicaid  [place] ([address]) . [facility] o Limited providers accepting new patients: Brake, NP; Wharton, PA o [address], [place], [place] [address] o [phone] o Mon-Fri 8:00-5:00 o Babies seen by providers at [facility] o Does NOT accept Medicaid o Only accepting babies of parents who are patients o Please call early in hospitalization for appointment (limited availability) . [facility] o Gay, MD; Quinlan, MD o [address] [place], [place] [address] o [phone] (press [phone] to schedule appointment) o Mon-Fri 8:00-5:00 o Providers come to see babies at [facility] o Does NOT accept Medicaid . [facility] o Mazer, MD o [address] [place], [place] [address] o [phone] o Mon-Fri 8:30-5:00 (lunch 12:30-1:00), extended hours by appointment only Wed 5:00-6:30 o Babies seen by [facility] providers o Accepting Medicaid . [facility] o Banks, MD; Jordan, MD; Koberlein, MD o [address], [place], [place] [address] o [phone] o Mon-Fri 8:00-5:00 o Babies seen by [facility] providers o Does NOT accept Medicaid . [facility] o Parker, MD; Hunter, MD; Wallace, DO o [address] [place], [place]  [address] o [phone] o Mon-Fri 8:00-5:00 o Babies seen by [facility] providers o Does NOT accept Medicaid . [facility] o Brandon, PA; Brecken, PA; Christy, NP; Dees, MD; DeClaire, MD; DeWeese, MD; Hansen, NP; Mills, NP; Parrish, NP; Smoot, NP; Summer, MD; Vapne, MD o [address] [place], [place] [address] o [phone] o Mon-Fri 8:30-5:00, Sat 10:00-1:00 o Providers come to see babies at [facility] o Does NOT accept Medicaid o Free prenatal information session Tuesdays at 4:45pm . [facility] o Bouska, MD; Gordon, PA; Jeffery, PA; Weber, PA o [address] [place] [address] o [phone] o Mon-Fri 7:30-5:30 o Babies seen by [facility] providers . [facility] o [address], [place], [place]  [address] o [phone]   Fax - [phone]  [place] ([address]) . [facility] o [place]ese, MD o [address] [place], [place] [address] o [phone] o Mon-Thur 8:00-6:00 o Providers come to see babies at [facility] o Accepting Medicaid . [facility] o Anderson, NP; Badger, MD; Beal, PA; Spencer, PA o [address] [place],  [address] o [phone] o Mon-Thur 7:30-7:30, Fri 7:30-4:30 o Babies seen by [facility] providers o Accepting Medicaid . [facility] o Agbuya, MD; Klett, NP; Romgoolam, MD o [address], ,  [address] o [phone] o Mon-Fri 8:30-5:00, Sat 8:30-12:00 o Providers come to see babies at [facility] o Accepting Medicaid o Must have "Meet & Greet" appointment at office prior to delivery . [facility] o McCord,   MD; Wallace, MD; Wood, MD o [address], [place], [place] [address] o [phone] o Mon-Wed 8:00-6:00, Thur-Fri 8:00-5:00, Sat 9:00-12:00 o Providers come to  see babies at [facility] o Does NOT accept Medicaid o Only accepting siblings of current patients . [facility]  o [address], [place], [place]  [address] o [phone]   Fax - [phone] . [facility] o [address], [place], [place]  [address] o [phone]   Fax - [phone]  [place] ([address] & [address]) . [facility] o Cirigliano, DO; Matthews, DO o [address] [place], [place] [address] o [phone] o Mon-Fri 7:00-5:00 o Babies seen by [facility] providers o Does NOT accept Medicaid . [facility] o Briscoe, MD; Howley, PA; Moreira, PA o [address], [place], [place] [address] o [phone] o Mon-Fri 8:00-5:00 o Babies seen by [facility] providers o Accepting Medicaid . [facility] o Boyd, MD; Church, PA; Jones, NP; Osborn, PA o [address], [place], [place] [address] o [phone] o Mon-Fri 8:00-5:00 o Babies seen by providers at [facility] o Accepting Medicaid  [place] ([address]) . [facility] [place] o Wendling, DO o [address] [place], [place] [address] o [phone] o Mon-Fri 8:00-5:00 o Babies seen by [facility] providers o Does NOT accept Medicaid o Limited availability, please call early in hospitalization to schedule follow-up . [facility] o Calderon, PA; Cummings, MD; Dillard, MD; Martin, PA; Olson, MD; VanDeven, PA o [address], [place], [place] [address] o [phone] o Mon-Fri 8:30-5:00, Sat 9:00-12:00 o Babies seen by providers at [facility] o Accepting Medicaid o Please register online then schedule online or call office o [email or web address] . [facility] o Hunter, NP; Kumar, MD; Martin Rogers, PA o [address], [place], [place] [address] o [phone] o Mon-Fri 8:00-5:00 o Babies seen by providers at [facility] o Accepting Medicaid . [facility] ([facility] o Mather, MD; Kristi Fleenor, NP; West, MD o [address], [place], [place] [address] o [phone] o Mon-Fri 8:00-5:30, Sat&Sun by appointment (phones open at 8:30) o Babies seen by [facility] providers o Accepting Medicaid o Must be a first-time baby or sibling of current patient . [facility] [place]  o [address], [place], [place]  [address] o [phone]   Fax - [phone]  [place] ([address]) . [facility] o Brown, PA; Cowen, PA; Rice, MD; Helton, PA; Spry, MD o [address] [place], [place] [address] o [phone] o Mon-Thur 8:00-7:00, Fri 8:00-5:00, Sat 8:00-12:00, Sun 9:00-12:00 o Babies seen by [facility] providers o Accepting Medicaid . [facility] [place] o Coe-Goins, MD; Marshall, MD; Pierre-Louis, MD o [address] [place] [address], [place], [place] [address] o [phone] o Mon-Thur 8:00-5:00 o Babies seen by providers at [facility] o Accepting Medicaid . [facility] o Bratton, MD; Coe-Goins, MD; Hayes, MD; Lewis, MD; List, MD; Lott, MD; Marshall, MD; Moran, MD; O'Neal, MD; Pierre-Louis, MD; Pitonzo, MD; Scholer, MD; Spangle, MD o [address] [place],    [address] o [phone] o Mon-Fri 8:00-5:30, Sat (Oct.-Mar.) 9:00-1:00 o Babies seen by providers at [facility] o Accepting Medicaid o Must fill out new patient packet, available online at [email or web address] . [facility] ([facility] o Friddle, NP; Harris, NP; Kelly, NP; Logan, MD;  Melvin, PA; Poth, MD; Ramadoss, MD; Stanton, NP o [address], [place], [place] [address] o [phone] o Mon-Thur 8:00-5:30, Fri 8:00-5:00 o Babies seen by providers at [facility] o Accepting Medicaid  [place] ([address]) . [facility] o Dixon, PA; [place], MD; Pickard, MD; Tapia, PA o [address], [place], [place] [address] o [phone] o Mon-Fri 8:00-5:00 o Babies seen by providers at [facility] o Accepting Medicaid   [place] ([address]) . [facility] [place] o Masneri, DO; Meyers, MD; Nelson, PA o [address], [place], [place] [address] o [phone] o Mon-Fri 8:00-5:00 o Babies seen by providers at [facility] o Does NOT accept Medicaid o Limited appointment availability, please call early in hospitalization  . [facility] [place] o Kunedd, DO; McGowen, MD o [address], [place], [place] [address] o [phone] o Mon-Fri 8:00-5:00 o Babies seen by [facility] providers o Does NOT accept Medicaid . [facility] [place] o Cameron, MD; MacDonald, MD; Michaels, PA; Nayak, MD o [address] [place] [address], [place], [place] [address] o [phone] o Mon-Fri 8:00-5:00 o After hours clinic ([address] [place], [place] [address]) [phone] Mon-Fri 5:00-8:00, Sat 12:00-6:00, Sun 10:00-4:00 o Babies seen by [facility] providers o Accepting Medicaid . [facility] [place] o [address], [place], [place]  [address] o [phone]   Fax - [phone]  [place] ([address]) . [facility] [place] [facility] o Andy, MD o [address], [place], [place] [address] o [phone] o Mon-Fri 8:00-5:00 o Babies seen by [facility] providers o Does NOT accept Medicaid . [facility] [place] ([facility] [place]) o Eksir, MD o [address], [place], [place]  [address] o [phone] o Mon-Thur 8:00-7:00, Fri 8:00-5:00, Sat 8:00-12:00 o Babies seen by providers at [facility] o Accepting Medicaid - but does not have vaccinations in office (must be received elsewhere) o Limited availability, please call early in hospitalization  [place] ([address]) . [facility]  o Charlene Flemming, MD o [address],   [address] o [phone]  Fax [phone]   

## 2020-10-06 NOTE — Progress Notes (Signed)
   LOW-RISK PREGNANCY OFFICE VISIT  Patient name: Paula Nunez MRN [id number]  Date of birth: Jan 18, 2001 Chief Complaint:   Routine Prenatal Visit  Subjective:   Paula Nunez is a 20 y.o. G1P0 female at [redacted]w[redacted]d with an Estimated Date of Delivery: 11/17/20 being seen today for ongoing management of a low-risk pregnancy aeb has Supervision of normal first pregnancy, antepartum; Anemia in pregnancy, second trimester; Gestational diabetes mellitus (GDM), antepartum; Hurler syndrome (HCC); and Alpha thalassemia silent carrier on their problem list.  Patient presents today with complaint of vaginal irritation. She reports yellow vaginal discharge and itching for 2-3 days.  Completed treatment for Trich and no sexual encounters since prior to treatment.  Patient denies vaginal concerns including abnormal discharge, leaking of fluid, and bleeding.  Patient endorses fetal movement and BH contractions.  No abdominal pain or cramping.  Contractions: Irregular. Vag. Bleeding: None.  Movement: Present.  Reviewed past medical,surgical, social, obstetrical and family history as well as problem list, medications and allergies.  Objective   Vitals:   10/06/20 1337  BP: 120/67  Pulse: (!) 106  Temp: 97.6 F (36.4 C)  Weight: 197 lb 3.2 oz (89.4 kg)  Body mass index is 32.82 kg/m.  Total Weight Gain:34 lb 3.2 oz (15.5 kg)         Physical Examination:   General appearance: Well appearing, and in no distress  Mental status: Alert, oriented to person, place, and time  Skin: Warm & dry  Cardiovascular: Normal heart rate noted  Respiratory: Normal respiratory effort, no distress  Abdomen: Soft, gravid, nontender, AGA with Fundal height of Fundal Height: 34 cm  Pelvic: Cervical exam deferred           Extremities: Edema: None  Fetal Status: Fetal Heart Rate (bpm): 140  Movement: Present   No results found for this or any previous visit (from the past 24 hour(s)).  Assessment & Plan:  Low-risk  pregnancy of a 20 y.o., G1P0 at [redacted]w[redacted]d with an Estimated Date of Delivery: 11/17/20   1. Supervision of normal first pregnancy, antepartum -Educated on GBS bacteria including what it is, why we test, and how and when we treat if needed. -Anticipatory guidance for upcoming appts. -RTO in 2 weeks for GBS.  2. Diet controlled gestational diabetes mellitus (GDM), antepartum -Reports been taking and levels have been "good." -Didn't bring log today. -Reports did not take BS this morning, but PP dinner was 115 -Instructed to send log to provider via mychart.   3. Vaginal itching -CV sent today. -Informed that will send treatment as appropriate.      Meds: No orders of the defined types were placed in this encounter.  Labs/procedures today:  Lab Orders  No laboratory test(s) ordered today     Reviewed: Preterm labor symptoms and general obstetric precautions including but not limited to vaginal bleeding, contractions, leaking of fluid and fetal movement were reviewed in detail with the patient.  All questions were answered.  Follow-up: No follow-ups on file.  No orders of the defined types were placed in this encounter.  Cherre Robins MSN, CNM 10/06/2020

## 2020-10-07 ENCOUNTER — Ambulatory Visit: Payer: Medicaid Other | Attending: Obstetrics and Gynecology

## 2020-10-07 ENCOUNTER — Ambulatory Visit: Payer: Medicaid Other | Admitting: *Deleted

## 2020-10-07 ENCOUNTER — Encounter: Payer: Self-pay | Admitting: *Deleted

## 2020-10-07 DIAGNOSIS — Z363 Encounter for antenatal screening for malformations: Secondary | ICD-10-CM | POA: Diagnosis not present

## 2020-10-07 DIAGNOSIS — O99013 Anemia complicating pregnancy, third trimester: Secondary | ICD-10-CM

## 2020-10-07 DIAGNOSIS — Z3400 Encounter for supervision of normal first pregnancy, unspecified trimester: Secondary | ICD-10-CM | POA: Insufficient documentation

## 2020-10-07 DIAGNOSIS — Z148 Genetic carrier of other disease: Secondary | ICD-10-CM | POA: Diagnosis not present

## 2020-10-07 DIAGNOSIS — O0933 Supervision of pregnancy with insufficient antenatal care, third trimester: Secondary | ICD-10-CM | POA: Diagnosis not present

## 2020-10-07 DIAGNOSIS — D649 Anemia, unspecified: Secondary | ICD-10-CM | POA: Diagnosis not present

## 2020-10-07 DIAGNOSIS — Z3A34 34 weeks gestation of pregnancy: Secondary | ICD-10-CM

## 2020-10-07 DIAGNOSIS — O24419 Gestational diabetes mellitus in pregnancy, unspecified control: Secondary | ICD-10-CM | POA: Diagnosis not present

## 2020-10-07 LAB — CERVICOVAGINAL ANCILLARY ONLY
Bacterial Vaginitis (gardnerella): POSITIVE — AB
Candida Glabrata: NEGATIVE
Candida Vaginitis: POSITIVE — AB
Chlamydia: NEGATIVE
Comment: NEGATIVE
Comment: NEGATIVE
Comment: NEGATIVE
Comment: NEGATIVE
Comment: NEGATIVE
Comment: NORMAL
Neisseria Gonorrhea: NEGATIVE
Trichomonas: POSITIVE — AB

## 2020-10-08 MED ORDER — METRONIDAZOLE 500 MG PO TABS: 500.0000 mg | ORAL_TABLET | Freq: Two times a day (BID) | ORAL | 0 refills | Status: DC

## 2020-10-08 MED ORDER — TERCONAZOLE 0.4 % VA CREA
1.0000 | TOPICAL_CREAM | Freq: Every day | VAGINAL | 0 refills | Status: DC
Start: 2020-10-08 — End: 2020-10-12

## 2020-10-08 NOTE — Addendum Note (Signed)
Addended by: Gerrit Heck L on: 10/08/2020 11:59 AM   Modules accepted: Orders

## 2020-10-12 ENCOUNTER — Other Ambulatory Visit: Payer: Self-pay | Admitting: *Deleted

## 2020-10-12 DIAGNOSIS — B9689 Other specified bacterial agents as the cause of diseases classified elsewhere: Secondary | ICD-10-CM

## 2020-10-12 DIAGNOSIS — A599 Trichomoniasis, unspecified: Secondary | ICD-10-CM

## 2020-10-12 MED ORDER — METRONIDAZOLE 500 MG PO TABS: 500.0000 mg | ORAL_TABLET | Freq: Two times a day (BID) | ORAL | 0 refills | Status: DC

## 2020-10-12 MED ORDER — TERCONAZOLE 0.4 % VA CREA: 1.0000 | TOPICAL_CREAM | Freq: Every day | VAGINAL | 0 refills | Status: DC

## 2020-10-22 ENCOUNTER — Other Ambulatory Visit (HOSPITAL_COMMUNITY)
Admission: RE | Admit: 2020-10-22 | Discharge: 2020-10-22 | Disposition: A | Discharge status: Home / Self Care | Payer: Medicaid Other | Source: Ambulatory Visit

## 2020-10-22 ENCOUNTER — Other Ambulatory Visit: Payer: Self-pay

## 2020-10-22 ENCOUNTER — Ambulatory Visit (INDEPENDENT_AMBULATORY_CARE_PROVIDER_SITE_OTHER): Payer: Medicaid Other

## 2020-10-22 VITALS — BP 129/76 | HR 80 | Temp 97.4°F | Wt 197.8 lb

## 2020-10-22 DIAGNOSIS — Z3400 Encounter for supervision of normal first pregnancy, unspecified trimester: Secondary | ICD-10-CM | POA: Diagnosis not present

## 2020-10-22 DIAGNOSIS — A599 Trichomoniasis, unspecified: Secondary | ICD-10-CM

## 2020-10-22 DIAGNOSIS — Z3A36 36 weeks gestation of pregnancy: Secondary | ICD-10-CM

## 2020-10-22 NOTE — Patient Instructions (Signed)

## 2020-10-22 NOTE — Progress Notes (Signed)
   LOW-RISK PREGNANCY OFFICE VISIT  Patient name: Paula Nunez MRN [id number]  Date of birth: Aug 13, 2001 Chief Complaint:   Routine Prenatal Visit  Subjective:   Paula Nunez is a 20 y.o. G1P0 female at [redacted]w[redacted]d with an Estimated Date of Delivery: 11/17/20 being seen today for ongoing management of a low-risk pregnancy aeb has Supervision of normal first pregnancy, antepartum; Anemia in pregnancy, second trimester; Gestational diabetes mellitus (GDM), antepartum; Hurler syndrome (HCC); and Alpha thalassemia silent carrier on their problem list.  Patient presents today with complaint of pelvic pressure.  Patient endorses fetal movement.  She reports contractions twice daily. Patient denies vaginal concerns including abnormal discharge, leaking of fluid, and bleeding. Patient reports that vaginal discharge is "turning back to normal white."  She states she has not had sex since last treatment for Trichomoniasis.  Contractions: Irregular. Vag. Bleeding: None.  Movement: Present.  Reviewed past medical,surgical, social, obstetrical and family history as well as problem list, medications and allergies.  Objective   Vitals:   10/22/20 0921  BP: 129/76  Pulse: 80  Temp: (!) 97.4 F (36.3 C)  Weight: 197 lb 12.8 oz (89.7 kg)  Body mass index is 32.92 kg/m.  Total Weight Gain:34 lb 12.8 oz (15.8 kg)         Physical Examination:   General appearance: Well appearing, and in no distress  Mental status: Alert, oriented to person, place, and time  Skin: Warm & dry  Cardiovascular: Normal heart rate noted  Respiratory: Normal respiratory effort, no distress  Abdomen: Soft, gravid, nontender, AGA with Fundal height of Fundal Height: 38 cm  Pelvic: NEFG with swelling/congestion noted.  Labia with apparent edema.  Cervical exam performed  Dilation: Closed Effacement (%): Thick Station: Ballotable Presentation: Undeterminable  Extremities: Edema: Trace  Fetal Status: Fetal Heart Rate (bpm):  141  Movement: Present   No results found for this or any previous visit (from the past 24 hour(s)).  Assessment & Plan:  Low-risk pregnancy of a 20 y.o., G1P0 at [redacted]w[redacted]d with an Estimated Date of Delivery: 11/17/20   1. Supervision of normal first pregnancy, antepartum -Anticipatory guidance for upcoming appts. -Patient to next appt in 1 week for a virtual visit. -GBS collected. -Will release results to mychart.  2. [redacted] weeks gestation of pregnancy -Doing well overall. -Reviewed normal physiological changes in pregnancy including pelvic pressure and back pain. -Encouraged to continue looking for a pediatrician.   3. Trichomoniasis -Discussed TOC today. -Informed that if infection remains provider will retreat patient and send script for EPT.    Meds: No orders of the defined types were placed in this encounter.  Labs/procedures today:  Lab Orders     Culture, beta strep (group b only)   Reviewed: Preterm labor symptoms and general obstetric precautions including but not limited to vaginal bleeding, contractions, leaking of fluid and fetal movement were reviewed in detail with the patient.  All questions were answered.  Follow-up: Return in about 1 week (around 10/29/2020) for Virtual LR-ROB.  Orders Placed This Encounter  Procedures  . Culture, beta strep (group b only)   Cherre Robins MSN, CNM 10/22/2020

## 2020-10-25 LAB — CERVICOVAGINAL ANCILLARY ONLY
Bacterial Vaginitis (gardnerella): POSITIVE — AB
Candida Glabrata: NEGATIVE
Candida Vaginitis: POSITIVE — AB
Chlamydia: NEGATIVE
Comment: NEGATIVE
Comment: NEGATIVE
Comment: NEGATIVE
Comment: NEGATIVE
Comment: NEGATIVE
Comment: NORMAL
Neisseria Gonorrhea: NEGATIVE
Trichomonas: POSITIVE — AB

## 2020-10-25 LAB — CULTURE, BETA STREP (GROUP B ONLY): Strep Gp B Culture: POSITIVE — AB

## 2020-10-26 ENCOUNTER — Other Ambulatory Visit: Payer: Self-pay | Admitting: *Deleted

## 2020-10-26 DIAGNOSIS — A599 Trichomoniasis, unspecified: Secondary | ICD-10-CM

## 2020-10-26 DIAGNOSIS — N760 Acute vaginitis: Secondary | ICD-10-CM

## 2020-10-26 DIAGNOSIS — B9689 Other specified bacterial agents as the cause of diseases classified elsewhere: Secondary | ICD-10-CM

## 2020-10-26 IMAGING — DX DG FOOT COMPLETE 3+V*L*
3 series · 3 of 3 positions shown · non-contrast
Comparison: None.

CLINICAL DATA: Multiple toe injuries

EXAM:
LEFT FOOT - COMPLETE 3+ VIEW

[foot ap]
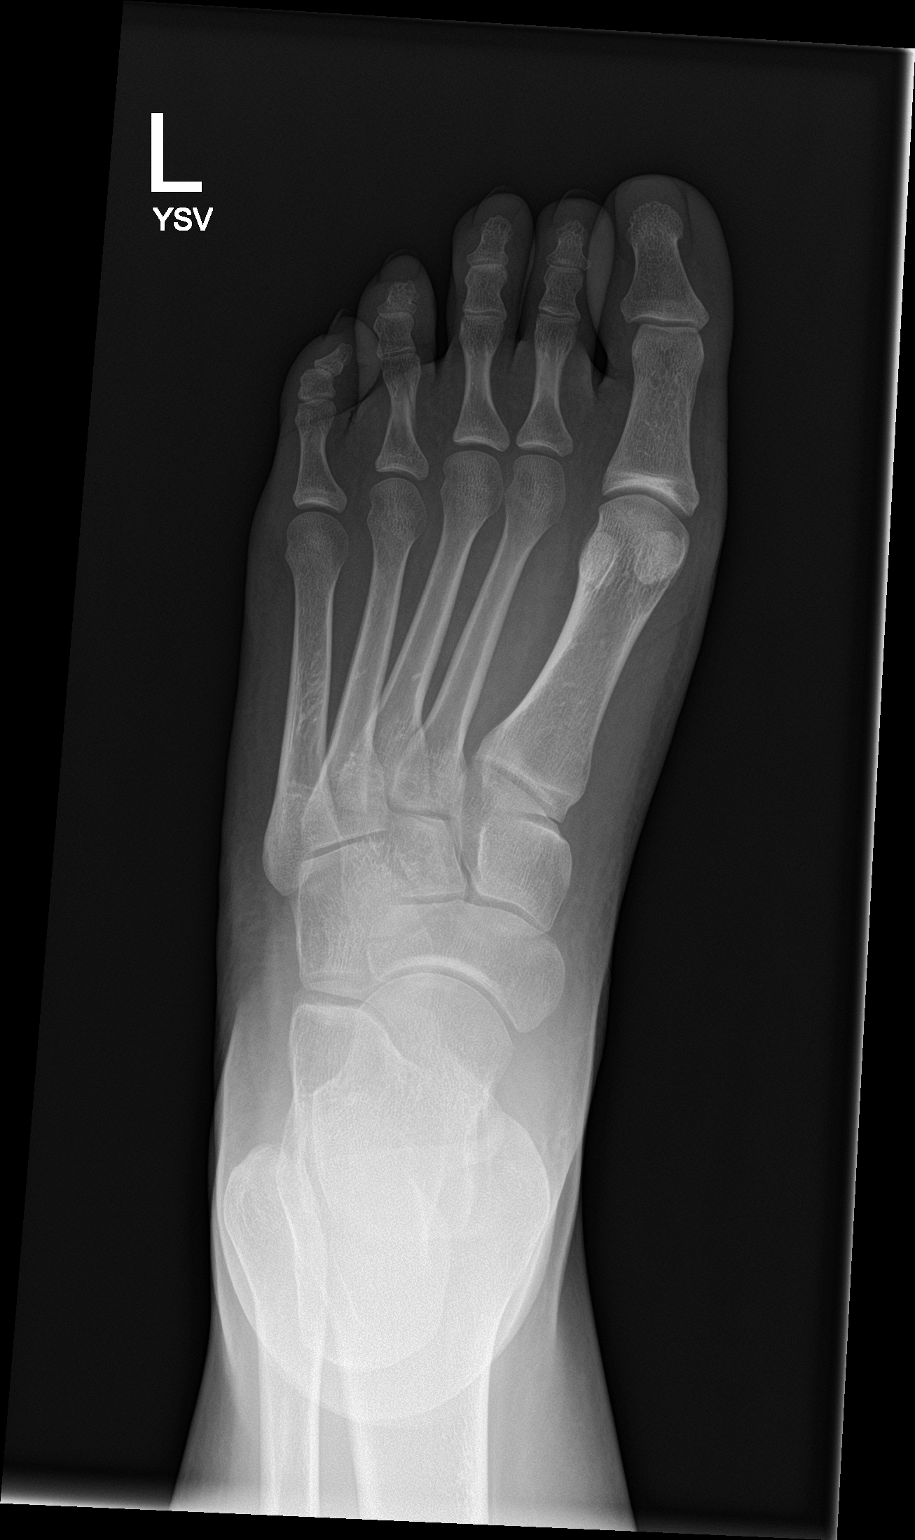

[foot obl]
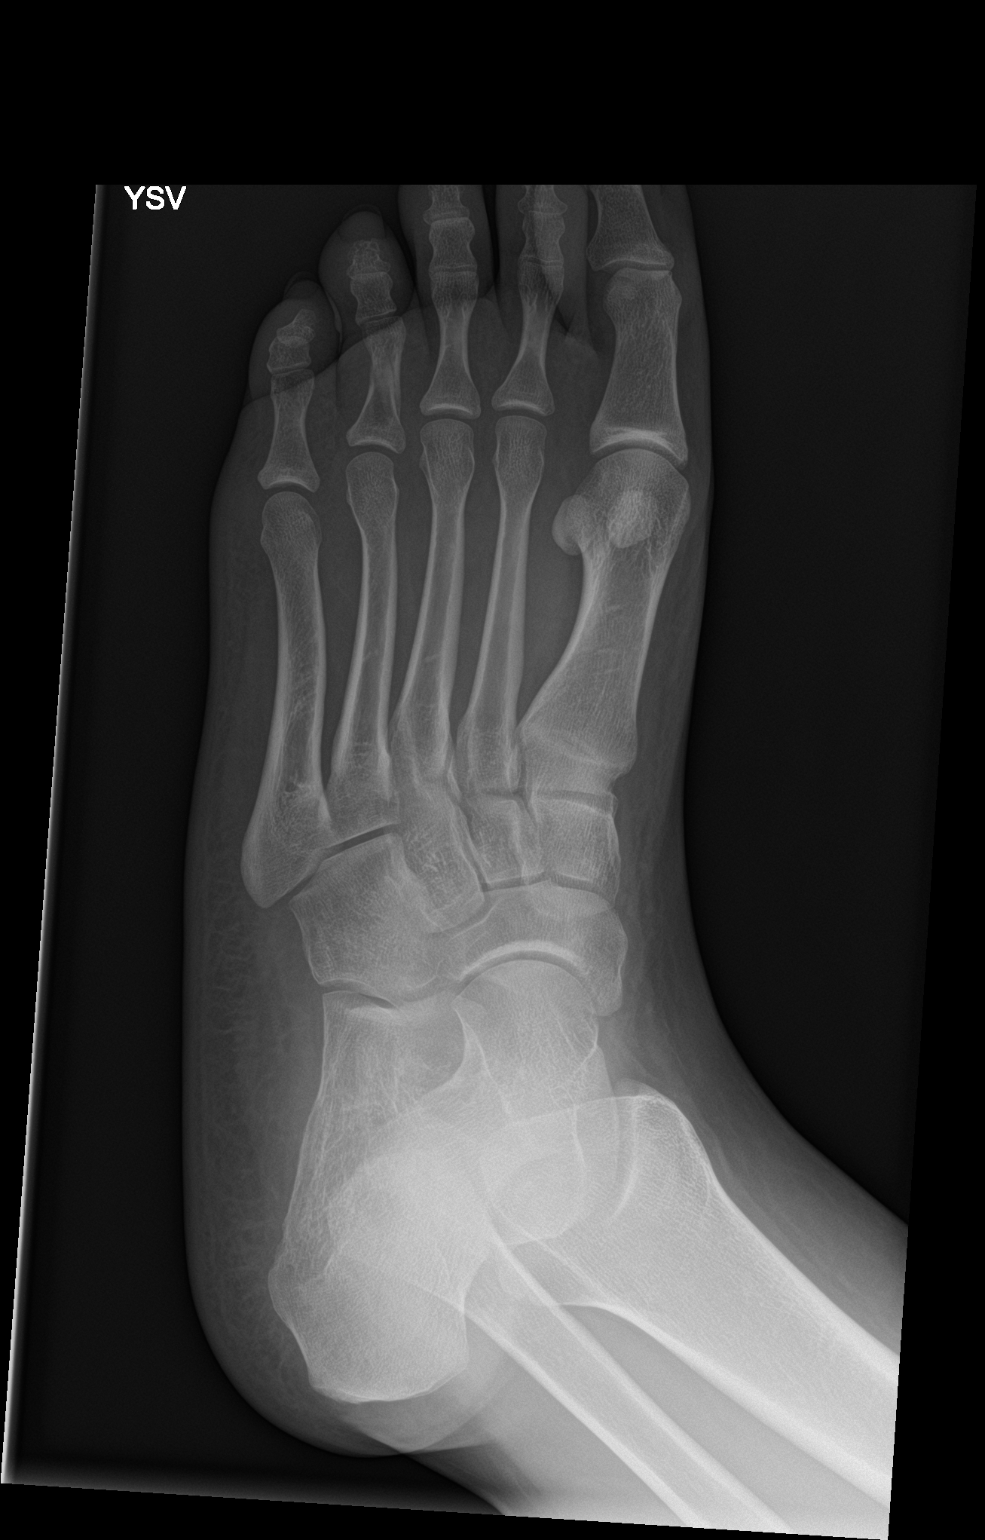

[foot lat]
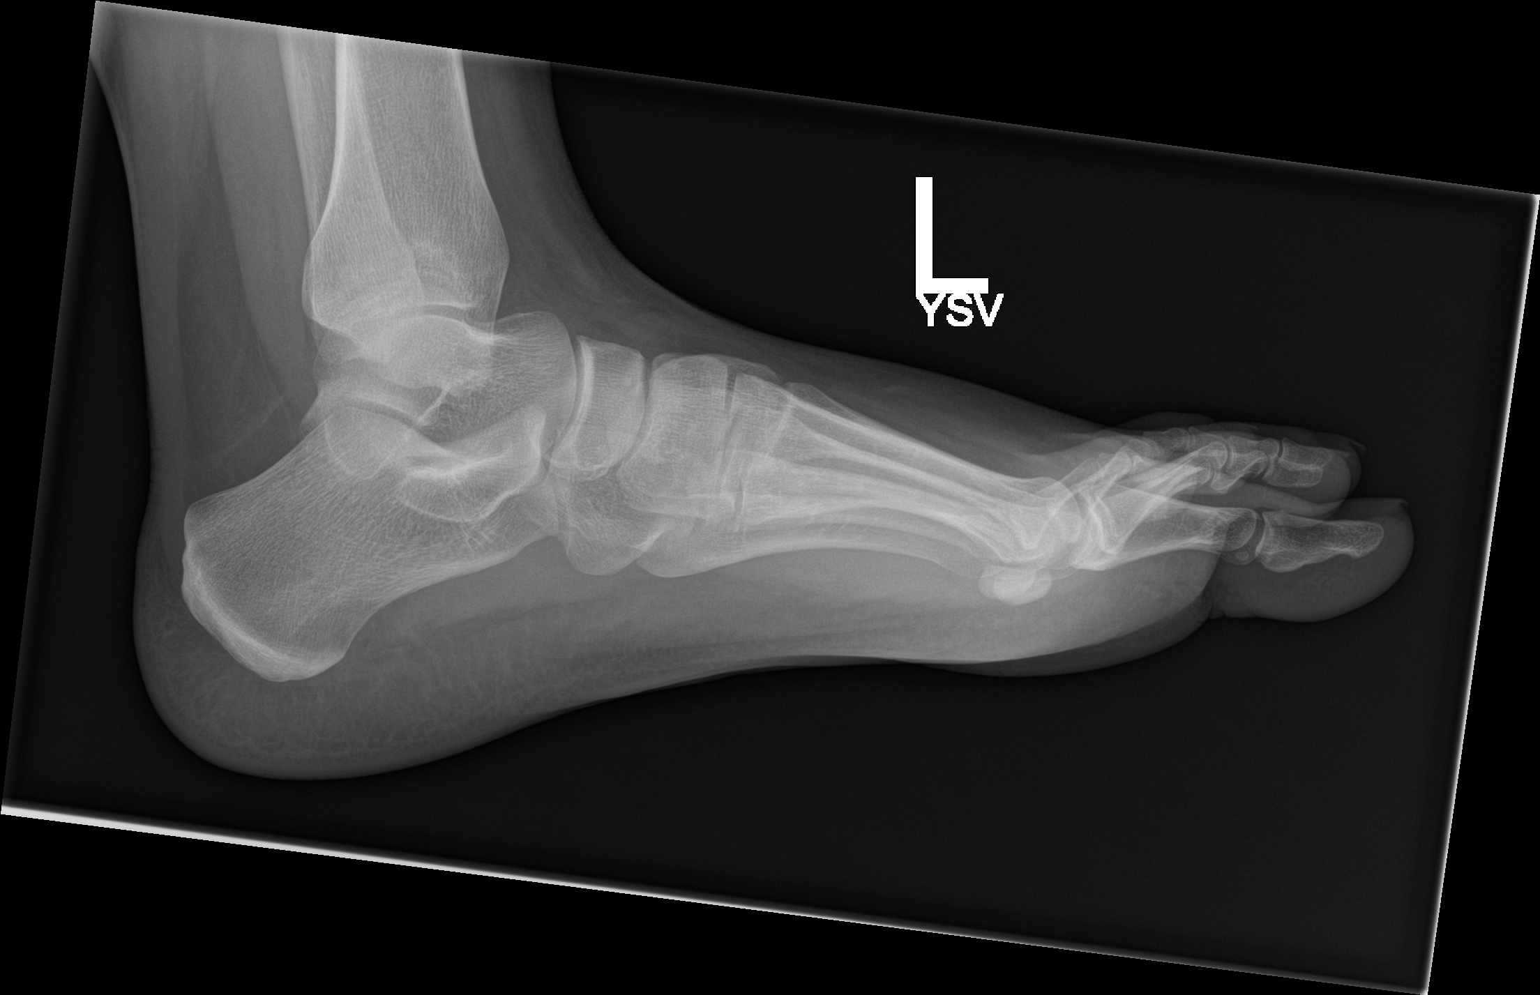

[3 of 3 positions shown; findings below may reference images not displayed]

FINDINGS: There is a nondisplaced linear lucency, likely obliquely oriented
fracture seen through the proximal fourth phalanx. No other fracture
seen. There is no evidence of arthropathy or other focal bone
abnormality. Dorsal soft tissue swelling.
IMPRESSION: Probable nondisplaced fracture of the proximal fourth phalanx.
Dorsal soft tissue swelling.

## 2020-10-26 MED ORDER — TINIDAZOLE 500 MG PO TABS: 2.0000 g | ORAL_TABLET | Freq: Every day | ORAL | 0 refills | Status: DC

## 2020-10-26 MED ORDER — TERCONAZOLE 0.4 % VA CREA: 1.0000 | TOPICAL_CREAM | Freq: Every day | VAGINAL | 0 refills | Status: DC

## 2020-10-26 NOTE — Progress Notes (Signed)
Tindamax 2 gm PO x 2 days and Terazole vaginal yeast cream sent to pharmacy per Standing Orders for +BV/Trich and yeast. Patient declined to take Metronidazole 500 mg for 7 days.  Clovis Pu, RN

## 2020-10-28 ENCOUNTER — Other Ambulatory Visit: Payer: Self-pay | Admitting: *Deleted

## 2020-10-28 DIAGNOSIS — A599 Trichomoniasis, unspecified: Secondary | ICD-10-CM

## 2020-10-28 DIAGNOSIS — N760 Acute vaginitis: Secondary | ICD-10-CM

## 2020-10-28 DIAGNOSIS — B9689 Other specified bacterial agents as the cause of diseases classified elsewhere: Secondary | ICD-10-CM

## 2020-10-28 MED ORDER — TINIDAZOLE 500 MG PO TABS: 2.0000 g | ORAL_TABLET | Freq: Every day | ORAL | 0 refills | Status: DC

## 2020-10-29 ENCOUNTER — Telehealth (INDEPENDENT_AMBULATORY_CARE_PROVIDER_SITE_OTHER): Payer: Medicaid Other

## 2020-10-29 VITALS — BP 129/80 | HR 76 | Wt 196.0 lb

## 2020-10-29 DIAGNOSIS — O24419 Gestational diabetes mellitus in pregnancy, unspecified control: Secondary | ICD-10-CM

## 2020-10-29 DIAGNOSIS — O99013 Anemia complicating pregnancy, third trimester: Secondary | ICD-10-CM

## 2020-10-29 DIAGNOSIS — Z3A37 37 weeks gestation of pregnancy: Secondary | ICD-10-CM

## 2020-10-29 DIAGNOSIS — B9689 Other specified bacterial agents as the cause of diseases classified elsewhere: Secondary | ICD-10-CM

## 2020-10-29 DIAGNOSIS — Z148 Genetic carrier of other disease: Secondary | ICD-10-CM

## 2020-10-29 DIAGNOSIS — A599 Trichomoniasis, unspecified: Secondary | ICD-10-CM

## 2020-10-29 DIAGNOSIS — N760 Acute vaginitis: Secondary | ICD-10-CM

## 2020-10-29 DIAGNOSIS — O98313 Other infections with a predominantly sexual mode of transmission complicating pregnancy, third trimester: Secondary | ICD-10-CM

## 2020-10-29 DIAGNOSIS — Z3400 Encounter for supervision of normal first pregnancy, unspecified trimester: Secondary | ICD-10-CM

## 2020-10-29 MED ORDER — TINIDAZOLE 500 MG PO TABS: 2.0000 g | ORAL_TABLET | Freq: Every day | ORAL | 0 refills | Status: AC

## 2020-10-29 NOTE — Progress Notes (Signed)
OBSTETRICS PRENATAL VIRTUAL VISIT ENCOUNTER NOTE  Provider location: [facility]   I connected with [name] on 10/29/20 at  9:30 AM EST by MyChart Video Encounter at home and verified that I am speaking with the correct person using two identifiers.   I discussed the limitations, risks, security and privacy concerns of performing an evaluation and management service virtually and the availability of in person appointments. I also discussed with the patient that there may be a patient responsible charge related to this service. The patient expressed understanding and agreed to proceed. Subjective:  Paula Nunez is a 20 y.o. G1P0 at [redacted]w[redacted]d being seen today for ongoing prenatal care.  She is currently monitored for the following issues for this low-risk pregnancy and has Supervision of normal first pregnancy, antepartum; Anemia in pregnancy, second trimester; Gestational diabetes mellitus (GDM), antepartum; Hurler syndrome (HCC); and Alpha thalassemia silent carrier on their problem list.  Patient reports pelvic pressure that she describes as cramps. She states the pain occurs primarily with walking. Patient endorses fetal movement. Patient denies abdominal cramping or contractions.  She also denies vaginal bleeding or leaking.   Contractions: Irregular. Vag. Bleeding: None.  Movement: Present. Denies any leaking of fluid.   The following portions of the patient's history were reviewed and updated as appropriate: allergies, current medications, past family history, past medical history, past social history, past surgical history and problem list.   Objective:   Vitals:   10/29/20 0914  BP: 129/80  Pulse: 76  Weight: 196 lb (88.9 kg)    Fetal Status:     Movement: Present     General:  Alert, oriented and cooperative. Patient is in no acute distress.  Respiratory: Normal respiratory effort, no problems with respiration noted  Mental Status:  Normal mood and affect. Normal behavior. Normal judgment and thought content.  Rest of physical exam deferred due to type of encounter  Imaging: [place] MFM FETAL BPP WO NON STRESS  Result Date: 10/07/2020 ----------------------------------------------------------------------  OBSTETRICS REPORT                       (Signed Final 10/07/2020 04:47 pm) ---------------------------------------------------------------------- Patient Info  ID #:       [id number]                          D.O.B.:  01-05-2001 (20 yrs)  Name:       Paula Nunez                Visit Date: 10/07/2020 04:22 pm ---------------------------------------------------------------------- Performed By  Attending:        Lin Landsman      Secondary Phy.:   Gerrit Heck                    MD                                                             CNM  Performed By:     Eden Lathe BS      Address:          Faculty Practice  RDMS RVT  Referred By:      [facility]        Location[facility] ---------------------------------------------------------------------- Orders  #  Description                           Code        Ordered By  1  [place] MFM FETAL BPP WO NON               76819.01    RAVI SHANKAR     STRESS  2  [place] MFM OB FOLLOW UP                   [id number]    RAVI [facility] ----------------------------------------------------------------------  #  Order #                     Accession #                Episode #  1  [id number]                   [id number]                 [id number]  2  [id number]                   [id number]                 [id number] ---------------------------------------------------------------------- Indications  [redacted] weeks gestation of pregnancy                Z3A.34  [redacted] weeks  gestation of pregnancy                Z3A.30  Late to prenatal care, third trimester         O09.33  Diabetes - Gestational                         O24.419  Anemia during pregnancy in third trimester     O99.013  Genetic carrier (SMA, Alpha-Thal, Hurler       Z14.8  Syndrome)  LR NIPS  Encounter for antenatal screening for          Z36.3  malformations ---------------------------------------------------------------------- Fetal Evaluation  Num Of Fetuses:  1  Fetal Heart Rate(bpm):  137  Cardiac Activity:       Observed  Presentation:           Cephalic  Placenta:               Anterior  P. Cord Insertion:      Visualized  Amniotic Fluid  AFI FV:      Within normal limits  AFI Sum(cm)     %Tile       Largest Pocket(cm)  15.2            54          5.7  RUQ(cm)       RLQ(cm)       LUQ(cm)        LLQ(cm)  5.7           1.3           3.9            4.3 ---------------------------------------------------------------------- Biophysical Evaluation  Amniotic F.V:   Within normal limits       F. Tone:        Observed  F. Movement:    Observed                   Score:          8/8  F. Breathing:   Observed ---------------------------------------------------------------------- Biometry  BPD:      86.6  mm     G. Age:  35w 0d         71  %    CI:        76.75   %    70 - 86                                                          FL/HC:      20.7   %    19.4 - 21.8  HC:      313.1  mm     G. Age:  35w 1d         37  %    HC/AC:      1.06        0.96 - 1.11  AC:      296.1  mm     G. Age:  33w 4d         38  %    FL/BPD:     74.8   %    71 - 87  FL:       64.8  mm     G. Age:  33w 3d         23  %    FL/AC:      21.9   %    20 - 24  HUM:      55.3  mm     G. Age:  32w 1d         18  %  LV:        6.6  mm  Est. FW:    2284  gm      5 lb 1 oz     34  % ---------------------------------------------------------------------- OB History  Blood Type:   Ren  Gravidity:    1  ---------------------------------------------------------------------- Gestational Age  LMP:           34w 1d        Date:  02/11/20                 EDD:   11/17/20  U/S Today:     34w 2d                                        EDD:   11/16/20  Best:          34w 1d     Det. By:  LMP  (02/11/20)          EDD:   11/17/20 ---------------------------------------------------------------------- Anatomy  Cranium:               Appears normal         LVOT:                   Appears normal  Cavum:                 Previously seen        Aortic Arch:            Appears normal  Ventricles:            Appears normal         Ductal Arch:            Appears normal  Choroid Plexus:        Appears normal         Diaphragm:              Appears normal  Cerebellum:            Appears normal         Stomach:                Appears normal, left                                                                        sided  Posterior Fossa:       Appears normal         Abdomen:                Appears normal  Nuchal Fold:           Not applicable (>20    Abdominal Wall:         Not well visualized                         wks GA)  Face:                  Appears normal         Cord Vessels:           Appears normal (3                         (orbits and profile)  vessel cord)  Lips:                  Appears normal         Kidneys:                Appear normal  Palate:                Appears normal         Bladder:                Appears normal  Thoracic:              Appears normal         Spine:                  Previously seen  Heart:                 Appears normal         Upper Extremities:      Previously seen                         (4CH, axis, and                         situs)  RVOT:                  Appears normal         Lower Extremities:      Previously seen  Other:  Fetus appears to be a female. Right hand not visualized. Heels          previously visualized. . Technically difficult due to advanced GA and           fetal position. ---------------------------------------------------------------------- Cervix Uterus Adnexa  Cervix  Not visualized (advanced GA >24wks)  Uterus  No abnormality visualized.  Right Ovary  Within normal limits.  Left Ovary  Within normal limits.  Cul De Sac  No free fluid seen.  Adnexa  No abnormality visualized. ---------------------------------------------------------------------- Impression  Follow up growth due to A1GDM  Normal interval growth with measurements consistent with  dates  Good fetal movement and amniotic fluid volume  Biophysical profile 8/8 ---------------------------------------------------------------------- Recommendations  Follow up as clinically indicated.  If therapy is required please consider initiating weekly testing. ----------------------------------------------------------------------               Lin Landsman, MD Electronically Signed Final Report   10/07/2020 04:47 pm ----------------------------------------------------------------------  [place] MFM OB FOLLOW UP  Result Date: 10/07/2020 ----------------------------------------------------------------------  OBSTETRICS REPORT                       (Signed Final 10/07/2020 04:47 pm) ---------------------------------------------------------------------- Patient Info  ID #:       [id number]                          D.O.B.:  August 27, 2001 (19 yrs)  Name:       [facility]                Visit Date: 10/07/2020 04:22 pm ---------------------------------------------------------------------- Performed By  Attending:        Lin Landsman      Secondary Phy.:   Gerrit Heck                    MD  CNM  Performed By:     Eden Lathearrie Stalter BS      Address:          Faculty Practice                    RDMS RVT  [facility] By:      [facility]        Location[facility]         [facility] ---------------------------------------------------------------------- Orders  #  Description                           Code        Ordered By  1  US MFM FETAL BPP WO NON               76819.01    RAVI SHANKAR     STRESS  2  US MFM OB FOLLOW UP                   [id number]    [facility] ----------------------------------------------------------------------  #  Order #                     Accession #                Episode #  1  [id number]                   [id number]                 [id number]  2  [id number]                   [id number]                 [id number] ---------------------------------------------------------------------- Indications  [redacted] weeks gestation of pregnancy                Z3A.34  [redacted] weeks gestation of pregnancy                Z3A.30  Late to prenatal care, third trimester         O09.33  Diabetes - Gestational                         O24.419  Anemia during pregnancy in third trimester  O99.013  Genetic carrier (SMA, Alpha-Thal, Hurler       Z14.8  Syndrome)  LR NIPS  Encounter for antenatal screening for          Z36.3  malformations ---------------------------------------------------------------------- Fetal Evaluation  Num Of Fetuses:         1  Fetal Heart Rate(bpm):  137  Cardiac Activity:       Observed  Presentation:           Cephalic  Placenta:               Anterior  P. Cord Insertion:      Visualized  Amniotic Fluid  AFI FV:      Within normal limits  AFI Sum(cm)     %Tile       Largest Pocket(cm)  15.2            54          5.7  RUQ(cm)       RLQ(cm)       LUQ(cm)        LLQ(cm)  5.7           1.3           3.9            4.3 ---------------------------------------------------------------------- Biophysical Evaluation  Amniotic F.V:   Within normal limits       F. Tone:        Observed  F. Movement:    Observed                   Score:          8/8  F.  Breathing:   Observed ---------------------------------------------------------------------- Biometry  BPD:      86.6  mm     G. Age:  35w 0d         71  %    CI:        76.75   %    70 - 86                                                          FL/HC:      20.7   %    19.4 - 21.8  HC:      313.1  mm     G. Age:  35w 1d         37  %    HC/AC:      1.06        0.96 - 1.11  AC:      296.1  mm     G. Age:  33w 4d         38  %    FL/BPD:     74.8   %    71 - 87  FL:       64.8  mm     G. Age:  33w 3d         23  %    FL/AC:      21.9   %    20 - 24  HUM:      55.3  mm     G. Age:  32w 1d         18  %  LV:  6.6  mm  Est. FW:    2284  gm      5 lb 1 oz     34  % ---------------------------------------------------------------------- OB History  Blood Type:   Ren  Gravidity:    1 ---------------------------------------------------------------------- Gestational Age  LMP:           34w 1d        Date:  02/11/20                 EDD:   11/17/20  U/S Today:     34w 2d                                        EDD:   11/16/20  Best:          34w 1d     Det. By:  LMP  (02/11/20)          EDD:   11/17/20 ---------------------------------------------------------------------- Anatomy  Cranium:               Appears normal         LVOT:                   Appears normal  Cavum:                 Previously seen        Aortic Arch:            Appears normal  Ventricles:            Appears normal         Ductal Arch:            Appears normal  Choroid Plexus:        Appears normal         Diaphragm:              Appears normal  Cerebellum:            Appears normal         Stomach:                Appears normal, left                                                                        sided  Posterior Fossa:       Appears normal         Abdomen:                Appears normal  Nuchal Fold:           Not applicable (>20    Abdominal Wall:         Not well visualized                         wks GA)  Face:                  Appears  normal         Cord Vessels:           Appears normal (3                         (  orbits and profile)                           vessel cord)  Lips:                  Appears normal         Kidneys:                Appear normal  Palate:                Appears normal         Bladder:                Appears normal  Thoracic:              Appears normal         Spine:                  Previously seen  Heart:                 Appears normal         Upper Extremities:      Previously seen                         (4CH, axis, and                         situs)  RVOT:                  Appears normal         Lower Extremities:      Previously seen  Other:  Fetus appears to be a female. Right hand not visualized. Heels          previously visualized. . Technically difficult due to advanced GA and          fetal position. ---------------------------------------------------------------------- Cervix Uterus Adnexa  Cervix  Not visualized (advanced GA >24wks)  Uterus  No abnormality visualized.  Right Ovary  Within normal limits.  Left Ovary  Within normal limits.  Cul De Sac  No free fluid seen.  Adnexa  No abnormality visualized. ---------------------------------------------------------------------- Impression  Follow up growth due to A1GDM  Normal interval growth with measurements consistent with  dates  Good fetal movement and amniotic fluid volume  Biophysical profile 8/8 ---------------------------------------------------------------------- Recommendations  Follow up as clinically indicated.  If therapy is required please consider initiating weekly testing. ----------------------------------------------------------------------               Lin Landsman, MD Electronically Signed Final Report   10/07/2020 04:47 pm ----------------------------------------------------------------------   Assessment and Plan:  Pregnancy: G1P0 at [redacted]w[redacted]d 1. Supervision of normal first pregnancy, antepartum -Anticipatory guidance for upcoming  appts. -Patient to next appt in 1 week for an in-person visit. -Reviewed GBS results and discussed treatment with PCN in labor.   2. [redacted] weeks gestation of pregnancy -Doing well overall. -Informed that pelvic pressure is anticipated change within this stage of pregnancy.   3. Trichomoniasis -Taking script and has one dose left. -Will send in refill for FOB treatment. -Instructed to abstain from sexual activity until at least 10 days after partner treated. -Plan for TOC in 2-3 weeks.  4. BV (bacterial vaginosis) -Taking script.  Term labor symptoms and general obstetric precautions including but not limited to vaginal bleeding, contractions, leaking of fluid and fetal movement were reviewed in  detail with the patient. I discussed the assessment and treatment plan with the patient. The patient was provided an opportunity to ask questions and all were answered. The patient agreed with the plan and demonstrated an understanding of the instructions. The patient was advised to call back or seek an in-person office evaluation/go to [facility] at [facility] for any urgent or concerning symptoms. Please refer to After Visit Summary for other counseling recommendations.   I provided 13 minutes of face-to-face time during this encounter.  No follow-ups on file.  Future Appointments  Date Time Provider Department Center  10/29/2020  9:30 AM Gerrit Heck, CNM [facility] None  11/05/2020  9:30 AM Nugent, Odie Sera, NP [facility] None  11/12/2020  9:30 AM Gerrit Heck, CNM [facility] None    Cherre Robins, CNM [facility] [employer]

## 2020-11-01 ENCOUNTER — Encounter: Payer: Self-pay | Admitting: *Deleted

## 2020-11-05 ENCOUNTER — Ambulatory Visit (INDEPENDENT_AMBULATORY_CARE_PROVIDER_SITE_OTHER): Payer: Medicaid Other | Admitting: Women's Health

## 2020-11-05 ENCOUNTER — Other Ambulatory Visit: Payer: Self-pay

## 2020-11-05 ENCOUNTER — Other Ambulatory Visit: Payer: Self-pay | Admitting: Advanced Practice Midwife

## 2020-11-05 VITALS — BP 136/75 | HR 88 | Temp 97.6°F | Wt 196.8 lb

## 2020-11-05 DIAGNOSIS — D563 Thalassemia minor: Secondary | ICD-10-CM

## 2020-11-05 DIAGNOSIS — Z148 Genetic carrier of other disease: Secondary | ICD-10-CM

## 2020-11-05 DIAGNOSIS — O24419 Gestational diabetes mellitus in pregnancy, unspecified control: Secondary | ICD-10-CM

## 2020-11-05 DIAGNOSIS — A599 Trichomoniasis, unspecified: Secondary | ICD-10-CM

## 2020-11-05 DIAGNOSIS — O99012 Anemia complicating pregnancy, second trimester: Secondary | ICD-10-CM

## 2020-11-05 DIAGNOSIS — Z3A38 38 weeks gestation of pregnancy: Secondary | ICD-10-CM

## 2020-11-05 DIAGNOSIS — E7601 Hurler's syndrome: Secondary | ICD-10-CM

## 2020-11-05 DIAGNOSIS — Z3400 Encounter for supervision of normal first pregnancy, unspecified trimester: Secondary | ICD-10-CM

## 2020-11-05 NOTE — Patient Instructions (Addendum)
Maternity Assessment Unit (MAU)  The Maternity Assessment Unit (MAU) is located at the [facility] and [place] at [facility]. The address is: [address], [place], [place], [place] [address]. Please see map below for additional directions.    The Maternity Assessment Unit is designed to help you during your pregnancy, and for up to 6 weeks after delivery, with any pregnancy- or postpartum-related emergencies, if you think you are in labor, or if your water has broken. For example, if you experience nausea and vomiting, vaginal bleeding, severe abdominal or pelvic pain, elevated blood pressure or other problems related to your pregnancy or postpartum time, please come to the Maternity Assessment Unit for assistance.        Signs and Symptoms of Labor Labor is the body's natural process of moving the baby and the placenta out of the uterus. The process of labor usually starts when the baby is full-term, between 23 and 40 weeks of pregnancy. Signs and symptoms that you are close to going into labor As your body prepares for labor and the birth of your baby, you may notice the following symptoms in the weeks and days before true labor starts:  Passing a small amount of thick, bloody mucus from your vagina. This is called normal bloody show or losing your mucus plug. This may happen more than a week before labor begins, or right before labor begins, as the opening of the cervix starts to widen (dilate). For some women, the entire mucus plug passes at once. For others, pieces of the mucus plug may gradually pass over several days.  Your baby moving (dropping) lower in your pelvis to get into position for birth (lightening). When this happens, you may feel more pressure on your bladder and pelvic bone and less pressure on your ribs. This may make it easier to breathe. It may also cause you to need to urinate more often and have problems with bowel movements.  Having "practice  contractions," also called Braxton Hicks contractions or false labor. These occur at irregular (unevenly spaced) intervals that are more than 10 minutes apart. False labor contractions are common after exercise or sexual activity. They will stop if you change position, rest, or drink fluids. These contractions are usually mild and do not get stronger over time. They may feel like: ? A backache or back pain. ? Mild cramps, similar to menstrual cramps. ? Tightening or pressure in your abdomen. Other early symptoms include:  Nausea or loss of appetite.  Diarrhea.  Having a sudden burst of energy, or feeling very tired.  Mood changes.  Having trouble sleeping.   Signs and symptoms that labor has begun Signs that you are in labor may include:  Having contractions that come at regular (evenly spaced) intervals and increase in intensity. This may feel like more intense tightening or pressure in your abdomen that moves to your back. ? Contractions may also feel like rhythmic pain in your upper thighs or back that comes and goes at regular intervals. ? For first-time mothers, this change in intensity of contractions often occurs at a more gradual pace. ? Women who have given birth before may notice a more rapid progression of contraction changes.  Feeling pressure in the vaginal area.  Your water breaking (rupture of membranes). This is when the sac of fluid that surrounds your baby breaks. Fluid leaking from your vagina may be clear or blood-tinged. Labor usually starts within 24 hours of your water breaking, but it  may take longer to begin. ? Some women may feel a sudden gush of fluid. ? Others notice that their underwear repeatedly becomes damp. Follow these instructions at home:  When labor starts, or if your water breaks, call your health care provider or nurse care line. Based on your situation, they will determine when you should go in for an exam.  During early labor, you may be able  to rest and manage symptoms at home. Some strategies to try at home include: ? Breathing and relaxation techniques. ? Taking a warm bath or shower. ? Listening to music. ? Using a heating pad on the lower back for pain. If you are directed to use heat:  Place a towel between your skin and the heat source.  Leave the heat on for 20-30 minutes.  Remove the heat if your skin turns bright red. This is especially important if you are unable to feel pain, heat, or cold. You may have a greater risk of getting burned.   Contact a health care provider if:  Your labor has started.  Your water breaks. Get help right away if:  You have painful, regular contractions that are 5 minutes apart or less.  Labor starts before you are [redacted] weeks along in your pregnancy.  You have a fever.  You have bright red blood coming from your vagina.  You do not feel your baby moving.  You have a severe headache with or without vision problems.  You have severe nausea, vomiting, or diarrhea.  You have chest pain or shortness of breath. These symptoms may represent a serious problem that is an emergency. Do not wait to see if the symptoms will go away. Get medical help right away. Call your local emergency services (911 in the [place] Do not drive yourself to the hospital. Summary  Labor is your body's natural process of moving your baby and the placenta out of your uterus.  The process of labor usually starts when your baby is full-term, between 94 and 40 weeks of pregnancy.  When labor starts, or if your water breaks, call your health care provider or nurse care line. Based on your situation, they will determine when you should go in for an exam. This information is not intended to replace advice given to you by your health care provider. Make sure you discuss any questions you have with your health care provider. Document Revised: 07/03/2020 Document Reviewed: 07/03/2020 Elsevier Patient Education  2021  Elsevier Inc.       Preeclampsia and Eclampsia Preeclampsia is a serious condition that may develop during pregnancy. This condition involves high blood pressure during pregnancy and causes symptoms such as headaches, vision changes, and increased swelling in the legs, hands, and face. Preeclampsia occurs after 20 weeks of pregnancy. Eclampsia is a seizure that happens from worsening preeclampsia. Diagnosing and managing preeclampsia early is important. If not treated early, it can cause serious problems for mother and baby. There is no cure for this condition. However, during pregnancy, delivering the baby may be the best treatment for preeclampsia or eclampsia. For most women, symptoms of preeclampsia and eclampsia go away after giving birth. In rare cases, a woman may develop preeclampsia or eclampsia after giving birth. This usually occurs within 48 hours after childbirth but may occur up to 6 weeks after giving birth. What are the causes? The cause of this condition is not known. What increases the risk? The following factors make you more likely to develop preeclampsia:  Being  pregnant for the first time or being pregnant with multiples.  Having had preeclampsia or a condition called hemolysis, elevated liver enzymes, and low platelet count (HELLP)syndrome during a past pregnancy.  Having a family history of preeclampsia.  Being older than age 24.  Being obese.  Becoming pregnant through fertility treatments. Conditions that reduce blood flow or oxygen to your placenta and baby may also increase your risk. These include:  High blood pressure before, during, or immediately following pregnancy.  Kidney disease.  Diabetes.  Blood clotting disorders.  Autoimmune diseases, such as lupus.  Sleep apnea. What are the signs or symptoms? Common symptoms of this condition include:  A severe, throbbing headache that does not go away.  Vision problems, such as blurred or double  vision and light sensitivity.  Pain in the stomach, especially the right upper region.  Pain in the shoulder. Other symptoms that may develop as the condition gets worse include:  Sudden weight gain because of fluid buildup in the body. This causes swelling of the face, hands, legs, and feet.  Severe nausea and vomiting.  Urinating less than usual.  Shortness of breath.  Seizures. How is this diagnosed? Your health care provider will ask you about symptoms and check for signs of preeclampsia during your prenatal visits. You will also have routine tests, including:  Checking your blood pressure.  Urine tests to check for protein.  Blood tests to assess your organ function.  Monitoring your baby's heart rate.  Ultrasounds to check fetal growth.   How is this treated? You and your health care provider will determine the treatment that is best for you. Treatment may include:  Frequent prenatal visits to check for preeclampsia.  Medicine to lower your blood pressure.  Medicine to prevent seizures.  Low-dose aspirin during your pregnancy.  Staying in the hospital, in severe cases. You will be given medicines to control your blood pressure and the amount of fluids in your body.  Delivering your baby. Work with your health care provider to manage any chronic health conditions, such as diabetes or kidney problems. Also, work with your health care provider to manage weight gain during pregnancy. Follow these instructions at home: Eating and drinking  Drink enough fluid to keep your urine pale yellow.  Avoid caffeine. Caffeine may increase blood pressure and heart rate and lead to dehydration.  Reduce the amount of salt that you eat. Lifestyle  Do not use any products that contain nicotine or tobacco. These products include cigarettes, chewing tobacco, and vaping devices, such as e-cigarettes. If you need help quitting, ask your health care provider.  Do not use alcohol or  drugs.  Avoid stress as much as possible.  Rest and get plenty of sleep. General instructions  Take over-the-counter and prescription medicines only as told by your health care provider.  When lying down, lie on your left side. This keeps pressure off your major blood vessels.  When sitting or lying down, raise (elevate) your feet. Try putting pillows underneath your lower legs.  Exercise regularly. Ask your health care provider what kinds of exercise are best for you.  Check your blood pressure as often as recommended by your health care provider.  Keep all prenatal and follow-up visits. This is important.   Contact a health care provider if:  You have symptoms that may need treatment or closer monitoring. These include: ? Headaches. ? Stomach pain or nausea and vomiting. ? Shoulder pain. ? Vision problems, such as spots in front  of your eyes or blurry vision. ? Sudden weight gain or increased swelling in your face, hands, legs, and feet. ? Increased anxiety or feeling of impending doom. ? Signs or symptoms of labor. Get help right away if:  You have any of the following symptoms: ? A seizure. ? Shortness of breath or trouble breathing. ? Trouble speaking or slurred speech. ? Fainting. ? Chest pain. These symptoms may represent a serious problem that is an emergency. Do not wait to see if the symptoms will go away. Get medical help right away. Call your local emergency services (911 in the [place] Do not drive yourself to the hospital. Summary  Preeclampsia is a serious condition that may develop during pregnancy.  Diagnosing and treating preeclampsia early is very important.  Keep all prenatal and follow-up visits. This is important.  Get help right away if you have a seizure, shortness of breath or trouble breathing, trouble speaking or slurred speech, chest pain, or fainting. This information is not intended to replace advice given to you by your health care provider.  Make sure you discuss any questions you have with your health care provider. Document Revised: 06/03/2020 Document Reviewed: 06/03/2020 Elsevier Patient Education  2021 Elsevier Inc.        Pregnancy and Anemia  Anemia is a condition in which there is not enough red blood cells or hemoglobin in the blood. Hemoglobin is a substance in red blood cells that carries oxygen. When you do not have enough red blood cells or hemoglobin (are anemic), your body cannot get enough oxygen and your organs may not work properly. Anemia is common during pregnancy because your body needs more blood volume and blood cells to provide nutrition to the unborn baby. What are the causes? The most common cause of anemia during pregnancy is not having enough iron in the body to make red blood cells (iron deficiency anemia). Other causes may include:  Folic acid deficiency.  Vitamin B12 deficiency.  Certain prescription or over-the-counter medicines.  Certain medical conditions or infections that destroy red blood cells.  A low platelet count and bleeding caused by antibodies that go through the placenta to the baby from the mother's blood. What are the signs or symptoms? Mild anemia may not cause any symptoms. If anemia becomes severe, symptoms may include:  Feeling tired or weak.  Shortness of breath, especially during activity.  Fainting.  Pale skin.  Headaches.  A fast or irregular heartbeat.  Dizziness. How is this diagnosed? This condition may be diagnosed based on your medical history and a physical exam. You may also have blood tests. How is this treated? Treatment for anemia during pregnancy depends on the cause of the anemia. Treatment may include:  Making changes to your diet.  Taking iron, vitamin B12, or folic acid supplements.  Having a blood transfusion. This may be needed if the anemia is severe. Follow these instructions at home: Eating and drinking  Follow  recommendations from your health care provider about changing your diet.  Eat a diet rich in iron. This would include foods such as: ? Liver. ? Beef. ? Eggs. ? Whole grains. ? Spinach. ? Dried fruit.  Increase your vitamin C intake. This will help the stomach absorb more iron. Some foods that are high in vitamin C include: ? Oranges. ? Peppers. ? Tomatoes. ? Mangoes.  Eat green leafy vegetables. These are a good source of folic acid. General instructions  Take iron supplements and vitamins as told by your  health care provider.  Keep all follow-up visits. This is important. Contact a health care provider if:  You have headaches that happen often or do not go away.  You bruise easily.  You have a fever.  You have nausea and vomiting for more than 24 hours.  You are unable to take supplements prescribed to treat your anemia. Get help right away if:  You develop signs or symptoms of severe anemia.  You have bleeding from your vagina.  You develop a rash.  You have bloody or tarry stools.  You are very dizzy or you faint. Summary  Anemia is a condition in which there is not enough red blood cells or hemoglobin in the blood.  The most common cause of anemia during pregnancy is not having enough iron in the body to make red blood cells (iron deficiency anemia).  Mild anemia may not cause any symptoms. If it becomes severe, symptoms may include feeling tired and weak.  Take iron supplements and vitamins as told by your health care provider.  Keep all follow-up visits. This is important. This information is not intended to replace advice given to you by your health care provider. Make sure you discuss any questions you have with your health care provider. Document Revised: 01/13/2020 Document Reviewed: 01/13/2020 Elsevier Patient Education  2021 Elsevier Inc.        Gestational Diabetes Mellitus, Self-Care Caring for yourself after a diagnosis of gestational  diabetes mellitus means keeping your blood sugar under control. This can be done through nutrition, exercise, lifestyle changes, insulin and other medicines, and support from your health care team. Your health care provider will set individualized treatment goals for you. What are the risks? If left untreated, gestational diabetes can cause problems for mother and baby. For the mother Women who get gestational diabetes are more likely to:  Have labor induced and deliver early.  Have problems during labor and delivery, if the baby is larger than normal. This includes difficult labor and damage to the birth canal.  Have a cesarean delivery.  Have problems with blood pressure, including high blood pressure and preeclampsia.  Get it again if they become pregnant.  Develop type 2 diabetes in the future. For the baby Gestational diabetes that is not treated can cause the baby to have:  Low blood glucose (hypoglycemia).  Larger-than-normal body size (macrosomia).  Breathing problems. How to monitor blood glucose Check your blood glucose every day and as often as told by your health care provider. To do this: 1. Wash your hands with soap and water for at least 20 seconds. 2. Prick the side of your finger (not the tip) with the lancet. Use a different finger each time. 3. Gently rub the finger until a small drop of blood appears. 4. Follow instructions that come with your meter for inserting the test strip, applying blood to the strip, and getting the result. 5. Write down your result and any notes. Blood glucose goals are:  95 mg/dL (5.3 mmol/L) when fasting.  140 mg/dL (7.8 mmol/L) 1 hour after a meal.  120 mg/dL (6.7 mmol/L) 2 hours after a meal.   Follow these instructions at home: Medicines  Take over-the-counter and prescription medicines only as told by your health care provider.  If your health care provider prescribed insulin or other diabetes medicines: ? Take them every  day. ? Do not run out of insulin or other medicines. Plan ahead so you always have them available. Eating and drinking  Follow instructions from your health care provider about eating or drinking restrictions.  See a diet and nutrition expert (registered dietician) to help you create an eating plan that helps control your diabetes. The foods in this plan will include: ? Lean proteins. ? Complex carbohydrates. These are carbohydrates that contain fiber, have a lot of nutrients, and are digested slowly. They include dried beans, nuts, and whole grain breads, cereals, or pasta. ? Fresh fruits and vegetables. ? Low-fat dairy products. ? Healthy fats.  Eat healthy snacks between nutritious meals.  Drink enough fluid to keep your urine pale yellow.  Keep a record of the carbohydrates that you eat. To do this: ? Read food labels. ? Learn the standard serving sizes of foods.  Make a sick day plan with your health care provider before you get sick. Follow this plan whenever you cannot eat or drink as usual.   Activity  Do exercises as told by your health care provider.  Do 30 or more minutes of physical activity a day, or as much physical activity as your health care provider recommends. It may help to control blood glucose levels after a meal if you: ? Do 10 minutes of exercise after each meal. ? Start this exercise 30 minutes after the meal.  If you start a new exercise or activity, work with your health care provider to adjust your insulin, other medicines, or food as needed. Lifestyle  Do not drink alcohol.  Do not use any products that contain nicotine or tobacco, such as cigarettes, e-cigarettes, and chewing tobacco. If you need help quitting, ask your health care provider.  Learn to manage stress. If you need help with this, ask your health care provider. Body care  Keep your vaccines up to date.  Practice good oral hygiene. To do this: ? Clean your teeth and gums two times a  day. ? Floss one or more times a day. ? Visit your dentist one or more times every 6 months.  Stay at a healthy weight while you are pregnant. Your expected weight gain depends on your BMI (body mass index) before pregnancy. General instructions  Talk with your health care provider about the risk for high blood pressure during pregnancy (preeclampsia and eclampsia).  Share your diabetes management plan with people in your workplace, school, and household.  Check your urine for ketones when sick and as told by your health care provider. Ketones are made by the liver when a lack of glucose forces the body to use fat for energy.  Carry a medical alert card or wear medical alert jewelry that says you have gestational diabetes.  Keep all follow-up visits. This is important. Get care after delivery  Have your blood glucose level checked with an oral glucose tolerance test (OGTT) 4-12 weeks after delivery.  Get screened for diabetes at least every 3 years, or as often as told by your health care provider. Where to find more information  American Diabetes Association (ADA): diabetes.org  Association of Diabetes Care & Education Specialists (ADCES): diabeteseducator.org  Centers for Disease Control and Prevention (CDC): TonerPromos.no  American Pregnancy Association: americanpregnancy.org  [place] Department of Agriculture MyPlate: WrestlingReporter.dk Contact a health care provider if:  Your blood glucose is above your target for two tests in a row.  You have a fever.  You have been sick for 2 days or more and are not getting better.  You have either of these problems for more than 6 hours: ? Vomiting every time you  eat or drink. ? Diarrhea. Get help right away if you:  Become confused or cannot think clearly.  Have trouble breathing.  Have moderate or high ketones in your urine.  Feel your baby is not moving as usual.  Develop unusual discharge or bleeding from your vagina.  Start having  early (premature) contractions. Contractions may feel like a tightening in your lower abdomen  Have a severe headache. These symptoms may represent a serious problem that is an emergency. Do not wait to see if the symptoms will go away. Get medical help right away. Call your local emergency services (911 in the [place] Do not drive yourself to the hospital. Summary  Check your blood glucose every day during your pregnancy. Do this as often as told by your health care provider.  Take insulin or other diabetes medicines every day, if your health care provider prescribed them.  Have your blood glucose level checked 4-12 weeks after delivery.  Keep all follow-up visits. This is important. This information is not intended to replace advice given to you by your health care provider. Make sure you discuss any questions you have with your health care provider. Document Revised: 02/16/2020 Document Reviewed: 02/16/2020 Elsevier Patient Education  2021 Elsevier Inc.        Trichomoniasis Trichomoniasis is an STI (sexually transmitted infection) that can affect both women and men. In women, the outer area of the female genitalia (vulva) and the vagina are affected. In men, mainly the penis is affected, but the prostate and other reproductive organs can also be involved.  This condition can be treated with medicine. It often has no symptoms (is asymptomatic), especially in men. If not treated, trichomoniasis can last for months or years. What are the causes? This condition is caused by a parasite called Trichomonas vaginalis. Trichomoniasis most often spreads from person to person (is contagious) through sexual contact. What increases the risk? The following factors may make you more likely to develop this condition:  Having unprotected sex.  Having sex with a partner who has trichomoniasis.  Having multiple sexual partners.  Having had previous trichomoniasis infections or other STIs. What  are the signs or symptoms? In women, symptoms of trichomoniasis include:  Abnormal vaginal discharge that is clear, white, gray, or yellow-green and foamy and has an unusual "fishy" odor.  Itching and irritation of the vagina and vulva.  Burning or pain during urination or sex.  Redness and swelling of the genitals. In men, symptoms of trichomoniasis include:  Penile discharge that may be foamy or contain pus.  Pain in the penis. This may happen only when urinating.  Itching or irritation inside the penis.  Burning after urination or ejaculation. How is this diagnosed? In women, this condition may be found during a routine Pap test or physical exam. It may be found in men during a routine physical exam. Your health care provider may do tests to help diagnose this infection, such as:  Urine tests (men and women).  The following in women: ? Testing the pH of the vagina. ? A vaginal swab test that checks for the Trichomonas vaginalis parasite. ? Testing vaginal secretions. Your health care provider may test you for other STIs, including HIV (human immunodeficiency virus). How is this treated? This condition is treated with medicine taken by mouth (orally), such as metronidazole or tinidazole, to fight the infection. Your sexual partner(s) also need to be tested and treated.  If you are a woman and you plan to become pregnant  or think you may be pregnant, tell your health care provider right away. Some medicines that are used to treat the infection should not be taken during pregnancy. Your health care provider may recommend over-the-counter medicines or creams to help relieve itching or irritation. You may be tested for infection again 3 months after treatment.   Follow these instructions at home:  Take and use over-the-counter and prescription medicines, including creams, only as told by your health care provider.  Take your antibiotic medicine as told by your health care  provider. Do not stop taking the antibiotic even if you start to feel better.  Do not have sex until 7-10 days after you finish your medicine, or until your health care provider approves. Ask your health care provider when you may start to have sex again.  (Women) Do not douche or wear tampons while you have the infection.  Discuss your infection with your sexual partner(s). Make sure that your partner gets tested and treated, if necessary.  Keep all follow-up visits as told by your health care provider. This is important. How is this prevented?  Use condoms every time you have sex. Using condoms correctly and consistently can help protect against STIs.  Avoid having multiple sexual partners.  Talk with your sexual partner about any symptoms that either of you may have, as well as any history of STIs.  Get tested for STIs and STDs (sexually transmitted diseases) before you have sex. Ask your partner to do the same.  Do not have sexual contact if you have symptoms of trichomoniasis or another STI.   Contact a health care provider if:  You still have symptoms after you finish your medicine.  You develop pain in your abdomen.  You have pain when you urinate.  You have bleeding after sex.  You develop a rash.  You feel nauseous or you vomit.  You plan to become pregnant or think you may be pregnant. Summary  Trichomoniasis is an STI (sexually transmitted infection) that can affect both women and men.  This condition often has no symptoms (is asymptomatic), especially in men.  Without treatment, this condition can last for months or years.  You should not have sex until 7-10 days after you finish your medicine, or until your health care provider approves. Ask your health care provider when you may start to have sex again.  Discuss your infection with your sexual partner(s). Make sure that your partner gets tested and treated, if necessary. This information is not intended to  replace advice given to you by your health care provider. Make sure you discuss any questions you have with your health care provider. Document Revised: 06/25/2018 Document Reviewed: 06/25/2018 Elsevier Patient Education  2021 [employer].        New Induction of Labor Process for [employer] [facility]  In Fall 2020 [facility] changed it's process for scheduling inductions of labor to create more induction slots and to make sure patients get COVID-19 testing in advance. After you have been tested you need to quarantine so that you do not get infected after your test. You should not go anywhere after your test except necessary medical appointments.  You have been scheduled for induction of labor on 11/10/2020. Although you may have a specific time listed on your After Visit Summary or MyChart, we cannot predict when your room will be available. Please disregard this time. A Labor and Delivery staff member will call you  on the day that you are scheduled when your room is available. You will need to arrive within one hour of being called. If you do not arrive within this time frame, the next person on the list will be called in and you will move down the list. You may eat a light meal before coming to the hospital. If you go into labor, think your water has broken, experience bright red bleeding or don't feel your baby moving as much as usual before your induction, please call your Ob/Gyn's office or come to Entrance C, Maternity Assessment Unit for evaluation.  Thank you,  [facility] [employer]

## 2020-11-05 NOTE — Progress Notes (Signed)
Subjective:  Paula Nunez is a 20 y.o. G1P0 at [redacted]w[redacted]d being seen today for ongoing prenatal care.  She is currently monitored for the following issues for this high-risk pregnancy and has Supervision of normal first pregnancy, antepartum; Anemia in pregnancy, second trimester; Gestational diabetes mellitus (GDM), antepartum; Hurler syndrome (HCC); Alpha thalassemia silent carrier; Trichomoniasis; and Genetic carrier on their problem list.  Patient reports no complaints.  Contractions: Irregular. Vag. Bleeding: None.  Movement: Present. Denies leaking of fluid.   The following portions of the patient's history were reviewed and updated as appropriate: allergies, current medications, past family history, past medical history, past social history, past surgical history and problem list. Problem list updated.  Objective:   Vitals:   11/05/20 0927 11/05/20 0953  BP: (!) 146/66 136/75  Pulse: (!) 102 88  Temp: 97.6 F (36.4 C)   Weight: 196 lb 12.8 oz (89.3 kg)     Fetal Status: Fetal Heart Rate (bpm): 140 Fundal Height: 38 cm Movement: Present     General:  Alert, oriented and cooperative. Patient is in no acute distress.  Skin: Skin is warm and dry. No rash noted.   Cardiovascular: Normal heart rate noted  Respiratory: Normal respiratory effort, no problems with respiration noted  Abdomen: Soft, gravid, appropriate for gestational age. Pain/Pressure: Present     Pelvic: Vag. Bleeding: None     Cervical exam deferred        Extremities: Normal range of motion.  Edema: Other (Comment)  Mental Status: Normal mood and affect. Normal behavior. Normal judgment and thought content.   Urinalysis:      Assessment and Plan:  Pregnancy: G1P0 at [redacted]w[redacted]d  1. Supervision of normal first pregnancy, antepartum -BP elevated today 146/66, will bring back on Monday AM for repeat BP -pt asymptomatic -repeat 136/75 -patient had single elevated BP at NOB visit at 8weeks, with normal repeat -pt has BP  cuff at home, but has not had cuff checked for fit, discussed appropriate way to take BP -Reviewed warning blood pressure values (systolic = / > 140 and/or diastolic =/> 90). Explained that, if blood pressure is elevated, she should sit down, rest, and eat/drink something. If still elevated 15 minutes later, and she is greater than 20 weeks, she should call clinic or come to MAU. She should come to MAU if she has elevated pressures and any of the following:  - headache not relieved with tylenol, rest, hydration -blurry vision, floating spots in her vision - sudden full-body edema or facial edema -RUQ pain that is constant. -chest pain/SOB -new onset N/V -full body swelling, swelling in face/hands These symptoms may indicate that her blood pressure is worsening and she may be developing gestational hypertension or pre-eclampsia, which is an emergency.    2. Anemia in pregnancy, second trimester -on oral iron CBC Latest Ref Rng & Units 08/27/2020  WBC 3.4 - 10.8 x10E3/uL 9.2  Hemoglobin 11.1 - 15.9 g/dL 10.0(L)  Hematocrit 34.0 - 46.6 % 30.6(L)  Platelets 150 - 450 x10E3/uL 259    3. Gestational diabetes mellitus (GDM), antepartum, gestational diabetes method of control unspecified -pt did not bring glucose log, reports 2hr pp glucose last night was 110, glucose meter not working in office today so unable to check patient BG -pt will message [place] with glucose numbers when she gets home today -pt reports some days her numbers were "kind of high" and states a few pp were past 120, around 165/175, and reports about 4 of these numbers were  elevated since her last visit -consulted with Dr. Shawnie Pons, will plan for delivery at 39 weeks, orders entered  4. Alpha thalassemia silent carrier -pt declined GC appointment with MFM on 09/09/2020  5. 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 Trichomoniasis -treated 10/22/2020, test next visit, confirmed patient and partner took treatment and waiting 7  days for intercourse  7. [redacted] weeks gestation of pregnancy  Term labor symptoms and general obstetric precautions including but not limited to vaginal bleeding, contractions, leaking of fluid and fetal movement were reviewed in detail with the patient. I discussed the assessment and treatment plan with the patient. The patient was provided an opportunity to ask questions and all were answered. The patient agreed with the plan and demonstrated an understanding of the instructions. The patient was advised to call back or seek an in-person office evaluation/go to [facility]U at [facility] for any urgent or concerning symptoms. Please refer to After Visit Summary for other counseling recommendations.  Return in about 3 days (around 11/08/2020) for in-person HOB/MD ONLY wants to discuss C/S, needs BP check this day, IOL 11/10/2020.   Alira Fretwell, Odie Sera, NP

## 2020-11-08 ENCOUNTER — Other Ambulatory Visit (HOSPITAL_COMMUNITY)
Admission: RE | Admit: 2020-11-08 | Discharge: 2020-11-08 | Disposition: A | Discharge status: Home / Self Care | Payer: Medicaid Other | Source: Ambulatory Visit | Attending: Obstetrics and Gynecology | Admitting: Obstetrics and Gynecology

## 2020-11-08 ENCOUNTER — Encounter: Payer: Self-pay | Admitting: *Deleted

## 2020-11-08 ENCOUNTER — Encounter (HOSPITAL_COMMUNITY): Payer: Self-pay | Admitting: *Deleted

## 2020-11-08 ENCOUNTER — Ambulatory Visit (INDEPENDENT_AMBULATORY_CARE_PROVIDER_SITE_OTHER): Payer: Medicaid Other | Admitting: Obstetrics and Gynecology

## 2020-11-08 ENCOUNTER — Other Ambulatory Visit: Payer: Self-pay

## 2020-11-08 ENCOUNTER — Encounter (HOSPITAL_COMMUNITY): Payer: Self-pay | Admitting: Obstetrics and Gynecology

## 2020-11-08 ENCOUNTER — Telehealth (HOSPITAL_COMMUNITY): Payer: Self-pay | Admitting: *Deleted

## 2020-11-08 ENCOUNTER — Inpatient Hospital Stay (HOSPITAL_COMMUNITY)
Admission: AD | Admit: 2020-11-08 | Discharge: 2020-11-11 | DRG: 807 | Disposition: A | Discharge status: Home / Self Care | Payer: Medicaid Other | Attending: Family Medicine | Admitting: Family Medicine

## 2020-11-08 VITALS — BP 138/77 | HR 82 | Wt 194.8 lb

## 2020-11-08 DIAGNOSIS — A599 Trichomoniasis, unspecified: Secondary | ICD-10-CM | POA: Diagnosis present

## 2020-11-08 DIAGNOSIS — Z3400 Encounter for supervision of normal first pregnancy, unspecified trimester: Secondary | ICD-10-CM

## 2020-11-08 DIAGNOSIS — Z148 Genetic carrier of other disease: Secondary | ICD-10-CM

## 2020-11-08 DIAGNOSIS — O24419 Gestational diabetes mellitus in pregnancy, unspecified control: Secondary | ICD-10-CM | POA: Diagnosis present

## 2020-11-08 DIAGNOSIS — Z3A38 38 weeks gestation of pregnancy: Secondary | ICD-10-CM

## 2020-11-08 DIAGNOSIS — D563 Thalassemia minor: Secondary | ICD-10-CM | POA: Diagnosis present

## 2020-11-08 DIAGNOSIS — E7601 Hurler's syndrome: Secondary | ICD-10-CM

## 2020-11-08 DIAGNOSIS — O99824 Streptococcus B carrier state complicating childbirth: Secondary | ICD-10-CM | POA: Diagnosis present

## 2020-11-08 DIAGNOSIS — O24410 Gestational diabetes mellitus in pregnancy, diet controlled: Secondary | ICD-10-CM

## 2020-11-08 DIAGNOSIS — Z20822 Contact with and (suspected) exposure to covid-19: Secondary | ICD-10-CM | POA: Diagnosis present

## 2020-11-08 DIAGNOSIS — O24420 Gestational diabetes mellitus in childbirth, diet controlled: Principal | ICD-10-CM | POA: Diagnosis present

## 2020-11-08 DIAGNOSIS — O99013 Anemia complicating pregnancy, third trimester: Secondary | ICD-10-CM | POA: Diagnosis present

## 2020-11-08 DIAGNOSIS — O134 Gestational [pregnancy-induced] hypertension without significant proteinuria, complicating childbirth: Secondary | ICD-10-CM | POA: Diagnosis present

## 2020-11-08 DIAGNOSIS — B951 Streptococcus, group B, as the cause of diseases classified elsewhere: Secondary | ICD-10-CM | POA: Diagnosis present

## 2020-11-08 DIAGNOSIS — O99012 Anemia complicating pregnancy, second trimester: Secondary | ICD-10-CM | POA: Diagnosis present

## 2020-11-08 DIAGNOSIS — O9902 Anemia complicating childbirth: Secondary | ICD-10-CM | POA: Diagnosis present

## 2020-11-08 DIAGNOSIS — O139 Gestational [pregnancy-induced] hypertension without significant proteinuria, unspecified trimester: Secondary | ICD-10-CM | POA: Diagnosis present

## 2020-11-08 HISTORY — DX: Gestational diabetes mellitus in pregnancy, unspecified control: O24.419

## 2020-11-08 LAB — COMPREHENSIVE METABOLIC PANEL
ALT: 8 U/L (ref 0–44)
AST: 15 U/L (ref 15–41)
Albumin: 2.9 g/dL — ABNORMAL LOW (ref 3.5–5.0)
Alkaline Phosphatase: 217 U/L — ABNORMAL HIGH (ref 38–126)
Anion gap: 11 (ref 5–15)
BUN: 10 mg/dL (ref 6–20)
CO2: 20 mmol/L — ABNORMAL LOW (ref 22–32)
Calcium: 9.1 mg/dL (ref 8.9–10.3)
Chloride: 104 mmol/L (ref 98–111)
Creatinine, Ser: 0.57 mg/dL (ref 0.44–1.00)
GFR, Estimated: >60 mL/min (ref >60)
Glucose, Bld: 76 mg/dL (ref 70–99)
Potassium: 3.2 mmol/L — ABNORMAL LOW (ref 3.5–5.1)
Sodium: 135 mmol/L (ref 135–145)
Total Bilirubin: 0.8 mg/dL (ref 0.3–1.2)
Total Protein: 6.9 g/dL (ref 6.5–8.1)

## 2020-11-08 LAB — CBC
HCT: 30.7 % — ABNORMAL LOW (ref 36.0–46.0)
Hemoglobin: 9.6 g/dL — ABNORMAL LOW (ref 12.0–15.0)
MCH: 24.3 pg — ABNORMAL LOW (ref 26.0–34.0)
MCHC: 31.3 g/dL (ref 30.0–36.0)
MCV: 77.7 fL — ABNORMAL LOW (ref 80.0–100.0)
Platelets: 316 10*3/uL (ref 150–400)
RBC: 3.95 MIL/uL (ref 3.87–5.11)
RDW: 14.1 % (ref 11.5–15.5)
WBC: 9.1 10*3/uL (ref 4.0–10.5)
nRBC: 0 % (ref 0.0–0.2)

## 2020-11-08 LAB — TYPE AND SCREEN
ABO/RH(D): O POS
Antibody Screen: NEGATIVE

## 2020-11-08 LAB — WET PREP, GENITAL
Sperm: NONE SEEN
Trich, Wet Prep: NONE SEEN
Yeast Wet Prep HPF POC: NONE SEEN

## 2020-11-08 LAB — RESP PANEL BY RT-PCR (FLU A&B, COVID) ARPGX2
Influenza A by PCR: NEGATIVE
Influenza B by PCR: NEGATIVE
SARS Coronavirus 2 by RT PCR: NEGATIVE

## 2020-11-08 LAB — GLUCOSE, CAPILLARY
Glucose-Capillary: 78 mg/dL (ref 70–99)
Glucose-Capillary: 98 mg/dL (ref 70–99)

## 2020-11-08 LAB — PROTEIN / CREATININE RATIO, URINE
Creatinine, Urine: 215.92 mg/dL
Protein Creatinine Ratio: 0.18 mg/mg{Cre} — ABNORMAL HIGH (ref 0.00–0.15)
Total Protein, Urine: 38 mg/dL

## 2020-11-08 MED ORDER — ACETAMINOPHEN 325 MG PO TABS
650.0000 mg | ORAL_TABLET | ORAL | Status: DC | PRN
  Filled 2020-11-08: qty 2

## 2020-11-08 MED ORDER — OXYTOCIN BOLUS FROM INFUSION: 333.0000 mL | Freq: Once | INTRAVENOUS | Status: DC

## 2020-11-08 MED ORDER — LACTATED RINGERS IV SOLN: INTRAVENOUS | Status: DC

## 2020-11-08 MED ORDER — OXYTOCIN-SODIUM CHLORIDE 30-0.9 UT/500ML-% IV SOLN
2.5000 [IU]/h | INTRAVENOUS | Status: DC
  Filled 2020-11-08: qty 500

## 2020-11-08 MED ORDER — FENTANYL CITRATE (PF) 100 MCG/2ML IJ SOLN
100.0000 ug | INTRAMUSCULAR | Status: DC | PRN
  Administered 2020-11-08 – 2020-11-09 (×6): 100 ug via INTRAVENOUS
  Filled 2020-11-08 (×6): qty 2

## 2020-11-08 MED ORDER — MISOPROSTOL 50MCG HALF TABLET
ORAL_TABLET | ORAL | Status: AC
  Administered 2020-11-08: 50 ug via ORAL
  Filled 2020-11-08: qty 1

## 2020-11-08 MED ORDER — LIDOCAINE HCL (PF) 1 % IJ SOLN: 30.0000 mL | INTRAMUSCULAR | Status: DC | PRN

## 2020-11-08 MED ORDER — MISOPROSTOL 50MCG HALF TABLET
50.0000 ug | ORAL_TABLET | Freq: Once | ORAL | Status: AC
  Filled 2020-11-08: qty 1

## 2020-11-08 MED ORDER — LACTATED RINGERS IV SOLN: 500.0000 mL | INTRAVENOUS | Status: DC | PRN

## 2020-11-08 MED ORDER — OXYTOCIN-SODIUM CHLORIDE 30-0.9 UT/500ML-% IV SOLN
1.0000 m[IU]/min | INTRAVENOUS | Status: DC
  Administered 2020-11-08: 2 m[IU]/min via INTRAVENOUS

## 2020-11-08 MED ORDER — FENTANYL CITRATE (PF) 100 MCG/2ML IJ SOLN
INTRAMUSCULAR | Status: AC
  Administered 2020-11-08: 100 ug via INTRAVENOUS
  Filled 2020-11-08: qty 2

## 2020-11-08 MED ORDER — TERBUTALINE SULFATE 1 MG/ML IJ SOLN: 0.2500 mg | Freq: Once | INTRAMUSCULAR | Status: DC | PRN

## 2020-11-08 MED ORDER — SOD CITRATE-CITRIC ACID 500-334 MG/5ML PO SOLN: 30.0000 mL | ORAL | Status: DC | PRN

## 2020-11-08 MED ORDER — OXYCODONE-ACETAMINOPHEN 5-325 MG PO TABS: 1.0000 | ORAL_TABLET | ORAL | Status: DC | PRN

## 2020-11-08 MED ORDER — PENICILLIN G POT IN DEXTROSE 60000 UNIT/ML IV SOLN
3.0000 10*6.[IU] | INTRAVENOUS | Status: DC
  Administered 2020-11-08 – 2020-11-09 (×5): 3 10*6.[IU] via INTRAVENOUS
  Filled 2020-11-08 (×5): qty 50

## 2020-11-08 MED ORDER — ONDANSETRON HCL 4 MG/2ML IJ SOLN
4.0000 mg | Freq: Four times a day (QID) | INTRAMUSCULAR | Status: DC | PRN
  Administered 2020-11-09 (×2): 4 mg via INTRAVENOUS
  Filled 2020-11-08 (×2): qty 2

## 2020-11-08 MED ORDER — SODIUM CHLORIDE 0.9 % IV SOLN
5.0000 10*6.[IU] | Freq: Once | INTRAVENOUS | Status: AC
  Administered 2020-11-08: 5 10*6.[IU] via INTRAVENOUS
  Filled 2020-11-08: qty 5

## 2020-11-08 MED ORDER — OXYCODONE-ACETAMINOPHEN 5-325 MG PO TABS: 2.0000 | ORAL_TABLET | ORAL | Status: DC | PRN

## 2020-11-08 NOTE — Patient Instructions (Signed)
Labor Induction Labor induction is when steps are taken to cause a pregnant woman to begin the labor process. Most women go into labor on their own between 37 weeks and 42 weeks of pregnancy. When this does not happen, or when there is a medical need for labor to begin, steps may be taken to induce, or bring on, labor. Labor induction causes a pregnant woman's uterus to contract. It also causes the cervix to soften (ripen), open (dilate), and thin out. Usually, labor is not induced before 39 weeks of pregnancy unless there is a medical reason to do so. When is labor induction considered? Labor induction may be right for you if:  Your pregnancy lasts longer than 41 to 42 weeks.  Your placenta is separating from your uterus (placental abruption).  You have a rupture of membranes and your labor does not begin.  You have health problems, like diabetes or high blood pressure (preeclampsia) during your pregnancy.  Your baby has stopped growing or does not have enough amniotic fluid. Before labor induction begins, your health care provider will consider the following factors:  Your medical condition and the baby's condition.  How many weeks you have been pregnant.  How mature the baby's lungs are.  The condition of your cervix.  The position of the baby.  The size of your birth canal. Tell a health care provider about:  Any allergies you have.  All medicines you are taking, including vitamins, herbs, eye drops, creams, and over-the-counter medicines.  Any problems you or your family members have had with anesthetic medicines.  Any surgeries you have had.  Any blood disorders you have.  Any medical conditions you have. What are the risks? Generally, this is a safe procedure. However, problems may occur, including:  Failed induction.  Changes in fetal heart rate, such as being too high, too low, or irregular (erratic).  Infection in the mother or the baby.  Increased risk of  having a cesarean delivery.  Breaking off (abruption) of the placenta from the uterus. This is rare.  Rupture of the uterus. This is very rare.  Your baby could fail to get enough blood flow or oxygen. This can be life-threatening. When induction is needed for medical reasons, the benefits generally outweigh the risks. What happens during the procedure? During the procedure, your health care provider will use one of these methods to induce labor:  Stripping the membranes. In this method, the amniotic sac tissue is gently separated from the cervix. This causes the following to happen: ? Your cervix stretches, which in turn causes the release of prostaglandins. ? Prostaglandins induce labor and cause the uterus to contract. ? This procedure is often done in an office visit. You will be sent home to wait for contractions to begin.  Prostaglandin medicine. This medicine starts contractions and causes the cervix to dilate and ripen. This can be taken by mouth (orally) or by being inserted into the vagina (suppository).  Inserting a small, thin tube (catheter) with a balloon into the vagina and then expanding the balloon with water to dilate the cervix.  Breaking the water. In this method, a small instrument is used to make a small hole in the amniotic sac. This eventually causes the amniotic sac to break. Contractions should begin within a few hours.  Medicine to trigger or strengthen contractions. This medicine is given through an IV that is inserted into a vein in your arm. This procedure may vary among health care providers and hospitals.     Where to find more information  March of Dimes: www.marchofdimes.org  The American College of Obstetricians and Gynecologists: www.acog.org Summary  Labor induction causes a pregnant woman's uterus to contract. It also causes the cervix to soften (ripen), open (dilate), and thin out.  Labor is usually not induced before 39 weeks of pregnancy unless  there is a medical reason to do so.  When induction is needed for medical reasons, the benefits generally outweigh the risks.  Talk with your health care provider about which methods of labor induction are right for you. This information is not intended to replace advice given to you by your health care provider. Make sure you discuss any questions you have with your health care provider. Document Revised: 06/24/2020 Document Reviewed: 06/24/2020 Elsevier Patient Education  2021 Elsevier Inc.  

## 2020-11-08 NOTE — H&P (Cosign Needed Addendum)
OBSTETRIC ADMISSION HISTORY AND PHYSICAL  Paula Nunez is a 20 y.o. female G1P0 with IUP at 14w5dby LMP presenting for IOL for A1GDM and gHTN. She reports +FMs, No LOF, no VB, no blurry vision, headaches or peripheral edema, and RUQ pain.  She plans on bottle feeding. She requests Depo Provera for birth control.  [facility]  Dating: By LMP --->  Estimated Date of Delivery: 11/17/20  Sono:    @[redacted]w[redacted]d , CWD, normal anatomy, cephalic,  11308M 357%EFW   Prenatal History/Complications:  AQ4ONG gHTN GBS (+) SMA alpha thalassemia silent carrier  Hurler syndrome anemia in pregnancy (admit hgb 9.6)  Past Medical History: Past Medical History:  Diagnosis Date  . Medical history non-contributory     Past Surgical History: Past Surgical History:  Procedure Laterality Date  . NO PAST SURGERIES      Obstetrical History: OB History    Gravida  1   Para      Term      Preterm      AB      Living        SAB      IAB      Ectopic      Multiple      Live Births              Social History Social History   Socioeconomic History  . Marital status: Single    Spouse name: Not on file  . Number of children: Not on file  . Years of education: Not on file  . Highest education level: High school graduate  Occupational History  . Occupation: [employer] Tobacco Use  . Smoking status: Never Smoker  . Smokeless tobacco: Never Used  Vaping Use  . Vaping Use: Never used  Substance and Sexual Activity  . Alcohol use: Not Currently  . Drug use: Not Currently    Types: Marijuana    Comment: last time used 08/2020  . Sexual activity: Yes    Birth control/protection: None  Other Topics Concern  . Not on file  Social History Narrative  . Not on file   Social Determinants of Health   Financial Resource Strain: Low Risk   . Difficulty of Paying Living Expenses: Not very hard  Food Insecurity: Food Insecurity Present  .  Worried About RCharity fundraiserin the Last Year: Sometimes true  . Ran Out of Food in the Last Year: Sometimes true  Transportation Needs: No Transportation Needs  . Lack of Transportation (Medical): No  . Lack of Transportation (Non-Medical): No  Physical Activity: Not on file  Stress: Not on file  Social Connections: Not on file    Family History: Family History  Problem Relation Age of Onset  . Healthy Mother   . Healthy Father     Allergies: No Known Allergies  Pt denies allergies to latex, iodine, or shellfish.  Medications Prior to Admission  Medication Sig Dispense Refill Last Dose  . Prenatal Vit-Fe Phos-FA-Omega (VITAFOL GUMMIES) 3.33-0.333-34.8 MG CHEW Chew 3 each by mouth daily. 90 tablet 12 11/08/2020 at Unknown time  . Accu-Chek Softclix Lancets lancets 1 each by Other route in the morning, at noon, in the evening, and at bedtime. Use as instructed 100 each 12   . Blood Glucose Monitoring Suppl (ACCU-CHEK GUIDE ME) w/Device KIT 1 Device by Does not apply route in the morning, at noon, in the evening, and at bedtime. 1 kit  0   . Blood Pressure Monitoring (BLOOD PRESSURE MONITOR AUTOMAT) DEVI 1 Device by Does not apply route daily. Automatic blood pressure cuff regular size. To monitor blood pressure regularly at home. ICD-10 code:Z34.90 1 each 0   . ferrous sulfate 325 (65 FE) MG EC tablet Take 1 tablet (325 mg total) by mouth every other day. 45 tablet 3 Unknown at Unknown time  . glucose blood (ACCU-CHEK GUIDE) test strip 1 each by Other route in the morning, at noon, in the evening, and at bedtime. Use as instructed 100 each 12   . Misc. Devices (GOJJI WEIGHT SCALE) MISC 1 Device by Does not apply route daily as needed. To weight self daily as needed at home. ICD-10 code: Z34.90 1 each 0   . ondansetron (ZOFRAN-ODT) 4 MG disintegrating tablet Take 1 tablet (4 mg total) by mouth every 8 (eight) hours as needed for nausea or vomiting. 15 tablet 0      Review of  Systems   All systems reviewed and negative except as stated in HPI  Blood pressure (!) 136/59, pulse 79, resp. rate 17, height 5' 5"  (1.651 m), weight 88.9 kg, last menstrual period 02/11/2020. General appearance: alert, cooperative, appears stated age and no distress Lungs: Normal breathing effort Heart: regular rate  Extremities: no sign of DVT Presentation: cephalic by cervical exam  Fetal monitoringBaseline: 140 bpm, Variability: Good {> 6 bpm), Accelerations: Reactive and Decelerations: Absent Uterine activityFrequency: Every 2-4 minutes Dilation: 1 Effacement (%): 50 Station: -2 Exam by:: Dr Sylvester Harder   Prenatal labs: ABO, Rh: --/--/O POS (02/14 1621) Antibody: NEG (02/14 1621) Rubella: 7.81 (12/03 1030) RPR: Non Reactive (12/03 1030)  HBsAg: Negative (12/03 1030)  HIV: Non Reactive (12/03 1030)  GBS: Positive/-- (01/28 0949)  2 hr Glucola: failed Genetic screening: SMA, alpha-Thal, Hurler syndrome  Anatomy US: Normal, limited exam due to advanced gestational age.  Prenatal Transfer Tool  Maternal Diabetes: Yes:  Diabetes Type:  Diet controlled Genetic Screening: Abnormal:  Results: Other: SMA, Alpha-Thalassemia silent carroer, Hurler Syndrome  Maternal Ultrasounds/Referrals: Normal Fetal Ultrasounds or other Referrals:  None Maternal Substance Abuse:  No Significant Maternal Medications:  None Significant Maternal Lab Results: Group B Strep positive  Results for orders placed or performed during the hospital encounter of 11/08/20 (from the past 24 hour(s))  CBC   Collection Time: 11/08/20  4:21 PM  Result Value Ref Range   WBC 9.1 4.0 - 10.5 K/uL   RBC 3.95 3.87 - 5.11 MIL/uL   Hemoglobin 9.6 (L) 12.0 - 15.0 g/dL   HCT 30.7 (L) 36.0 - 46.0 %   MCV 77.7 (L) 80.0 - 100.0 fL   MCH 24.3 (L) 26.0 - 34.0 pg   MCHC 31.3 30.0 - 36.0 g/dL   RDW 14.1 11.5 - 15.5 %   Platelets 316 150 - 400 K/uL   nRBC 0.0 0.0 - 0.2 %  Type and screen   Collection Time: 11/08/20   4:21 PM  Result Value Ref Range   ABO/RH(D) O POS    Antibody Screen NEG    Sample Expiration      11/11/2020,2359 Performed at [facility], [address], [place], [place] [address]   Resp Panel by RT-PCR (Flu A&B, Covid) Nasopharyngeal Swab   Collection Time: 11/08/20  4:30 PM   Specimen: Nasopharyngeal Swab; Nasopharyngeal(NP) swabs in vial transport medium  Result Value Ref Range   SARS Coronavirus 2 by RT PCR NEGATIVE NEGATIVE   Influenza A by PCR NEGATIVE  NEGATIVE   Influenza B by PCR NEGATIVE NEGATIVE  Comprehensive metabolic panel   Collection Time: 11/08/20  4:46 PM  Result Value Ref Range   Sodium 135 135 - 145 mmol/L   Potassium 3.2 (L) 3.5 - 5.1 mmol/L   Chloride 104 98 - 111 mmol/L   CO2 20 (L) 22 - 32 mmol/L   Glucose, Bld 76 70 - 99 mg/dL   BUN 10 6 - 20 mg/dL   Creatinine, Ser 0.57 0.44 - 1.00 mg/dL   Calcium 9.1 8.9 - 10.3 mg/dL   Total Protein 6.9 6.5 - 8.1 g/dL   Albumin 2.9 (L) 3.5 - 5.0 g/dL   AST 15 15 - 41 U/L   ALT 8 0 - 44 U/L   Alkaline Phosphatase 217 (H) 38 - 126 U/L   Total Bilirubin 0.8 0.3 - 1.2 mg/dL   GFR, Estimated >60 >60 mL/min   Anion gap 11 5 - 15  Glucose, capillary   Collection Time: 11/08/20  4:55 PM  Result Value Ref Range   Glucose-Capillary 78 70 - 99 mg/dL    Patient Active Problem List   Diagnosis Date Noted  . [redacted] weeks gestation of pregnancy 11/08/2020  . Gestational diabetes 11/08/2020  . Genetic carrier 11/05/2020  . Trichomoniasis 10/29/2020  . Hurler syndrome ([place]) 09/11/2020  . Alpha thalassemia silent carrier 09/11/2020  . Gestational diabetes mellitus (GDM), antepartum 09/08/2020  . Anemia in pregnancy, second trimester 08/29/2020  . Supervision of normal first pregnancy, antepartum 04/12/2020    Assessment/Plan:  Javier Araki is a 20 y.o. G1P0 at 3w5dhere for IOL for A1GDM, g HTN.  #Labor: IOL for A1GDM and gHTN. Received 50 mcg of Cytotec and inserted FB. Will likely receive another dose of  Cytotec in 4 hours.  #Pain: Epidural  #FWB: Cat 1 #ID: GBS (+), PCN #MOF: Bottle  #MOC: Depo #Circ: Yes   #A1GDM:   - Patient does not routinely monitor BG. Will check CBG q 4 hours in latent labor, and q 2h in active labor  #gHTN:   -BP elevated in clinic, sent to L&D for IOL, no severe range pressures   - Patient is asymptomatic.   -preE labs nml, p/c pending   - consider medication management if symptomatic.  #Trichomoniasis:    - Cervicovaginal ancillary (+) 10/22/20- patient reported completing treatment course.    - Wet prep pending  #Anemia in pregnancy:   - Most recent Hgb noted at 9.6, to consider use of PO vs IV iron post-partum.  #[place] Syndrome ([place]: #Alpha thalassemia silent carrier:   RRockledgefor [facility] [place]2/14/2022, 6:35 PM   GME ATTESTATION:  I saw and evaluated the patient. I agree with the findings and the plan of care as documented in the PA student's note.  AArrie Senate MD OB Fellow, [place]for [place]2/14/2022 7:17 PM

## 2020-11-08 NOTE — Telephone Encounter (Signed)
Preadmission screen  

## 2020-11-08 NOTE — Progress Notes (Signed)
Labor Progress Note Paula Nunez is a 20 y.o. G1P0 at [redacted]w[redacted]d presented for IOL for gHTN   S:  Pt resting in the bed, feeling contractions somewhat stronger and q3-74min, tolerated cervical exam well. Support at bedside. Planning epidural when things get more intense.  O:  BP (!) 147/71   Pulse 81   Resp 17   Ht 5\' 5"  (1.651 m)   Wt 196 lb 1 oz (88.9 kg)   LMP 02/11/2020 (Approximate)   BMI 32.63 kg/m  EFM: baseline 140 bpm/ moderate variability/ present accels/ none decels  Toco/IUPC: irregular q3-54min and coupleted SVE: Dilation: 4 Effacement (%): 50 Cervical Position: Posterior Station: -3 Presentation: Vertex Exam by:: Dr [email or web address] Pitocin: none, will initiate as needed  A/P: 20 y.o. G1P0 [redacted]w[redacted]d  1. Labor: progressing well with cytotec and FB, FB out 2. FWB: Cat 1 3. Pain: Well controlled with bedside support 4. gHTN: labs currently normal 5. A1GDM: q4 testing normal  Monitor contraction pattern, will initiate Pitocin if contraction pattern remains spaced out. Anticipate SVD.  [redacted]w[redacted]d, CNM, MSN, IBCLC Certified Nurse Midwife, [facility]

## 2020-11-08 NOTE — Progress Notes (Signed)
Patient presents for ROB. Patient states that her fasting sugar this am was 95. She did not bring her blood sugar log. She denies having any headache or visual changes.Patient has no concerns today.

## 2020-11-08 NOTE — Progress Notes (Signed)
   PRENATAL VISIT NOTE  Subjective:  Paula Nunez is a 20 y.o. G1P0 at [redacted]w[redacted]d being seen today for ongoing prenatal care.  She is currently monitored for the following issues for this high-risk pregnancy and has Supervision of normal first pregnancy, antepartum; Anemia in pregnancy, second trimester; Gestational diabetes mellitus (GDM), antepartum; Hurler syndrome (HCC); Alpha thalassemia silent carrier; Trichomoniasis; Genetic carrier; and [redacted] weeks gestation of pregnancy on their problem list.  Patient doing well with no acute concerns today. She reports no complaints.  Contractions: Irregular. Vag. Bleeding: None.  Movement: Present. Denies leaking of fluid.   FBS:  50-60 PPBS: ? 145 No glucose sheet present today  The following portions of the patient's history were reviewed and updated as appropriate: allergies, current medications, past family history, past medical history, past social history, past surgical history and problem list. Problem list updated.  Objective:   Vitals:   11/08/20 1431 11/08/20 1435  BP: (!) 144/75 138/77  Pulse: 69 82  Weight: 194 lb 12.8 oz (88.4 kg)   repeat BP 146/79  Fetal Status: Fetal Heart Rate (bpm): 136   Movement: Present     General:  Alert, oriented and cooperative. Patient is in no acute distress.  Skin: Skin is warm and dry. No rash noted.   Cardiovascular: Normal heart rate noted  Respiratory: Normal respiratory effort, no problems with respiration noted  Abdomen: Soft, gravid, appropriate for gestational age.  Pain/Pressure: Present     Pelvic: Cervical exam performed Dilation: Fingertip Effacement (%): 60 Station: -2  Extremities: Normal range of motion.  Edema: Mild pitting, slight indentation  Mental Status:  Normal mood and affect. Normal behavior. Normal judgment and thought content.   Assessment and Plan:  Pregnancy: G1P0 at [redacted]w[redacted]d  1. Alpha thalassemia silent carrier   2. Hurler syndrome (HCC)   3. Supervision of normal  first pregnancy, antepartum   4. Diet controlled gestational diabetes mellitus (GDM), antepartum Blood sugars moderately controlled , IOL scheduled for Wednesday  5. [redacted] weeks gestation of pregnancy   6. Trichomoniasis TOC completed today - Cervicovaginal ancillary only  7. Genetic carrier   Term labor symptoms and general obstetric precautions including but not limited to vaginal bleeding, contractions, leaking of fluid and fetal movement were reviewed in detail with the patient.  Please refer to After Visit Summary for other counseling recommendations.  Pt to be sent to L and D to start IOL due to elevated blood pressure x 2.  Pt discussed in detail with Dr. Crissie Reese No follow-ups on file.   Mariel Aloe, MD Faculty Attending Center for [facility]

## 2020-11-09 ENCOUNTER — Inpatient Hospital Stay (HOSPITAL_COMMUNITY): Payer: Medicaid Other | Admitting: Anesthesiology

## 2020-11-09 ENCOUNTER — Other Ambulatory Visit (HOSPITAL_COMMUNITY): Payer: Medicaid Other | Attending: Family Medicine

## 2020-11-09 ENCOUNTER — Encounter (HOSPITAL_COMMUNITY): Payer: Self-pay | Admitting: Obstetrics & Gynecology

## 2020-11-09 DIAGNOSIS — B951 Streptococcus, group B, as the cause of diseases classified elsewhere: Secondary | ICD-10-CM | POA: Diagnosis present

## 2020-11-09 DIAGNOSIS — O139 Gestational [pregnancy-induced] hypertension without significant proteinuria, unspecified trimester: Secondary | ICD-10-CM

## 2020-11-09 DIAGNOSIS — O134 Gestational [pregnancy-induced] hypertension without significant proteinuria, complicating childbirth: Secondary | ICD-10-CM | POA: Diagnosis not present

## 2020-11-09 DIAGNOSIS — O24429 Gestational diabetes mellitus in childbirth, unspecified control: Secondary | ICD-10-CM | POA: Diagnosis not present

## 2020-11-09 DIAGNOSIS — O4202 Full-term premature rupture of membranes, onset of labor within 24 hours of rupture: Secondary | ICD-10-CM | POA: Diagnosis not present

## 2020-11-09 DIAGNOSIS — D563 Thalassemia minor: Secondary | ICD-10-CM | POA: Diagnosis not present

## 2020-11-09 DIAGNOSIS — O99824 Streptococcus B carrier state complicating childbirth: Secondary | ICD-10-CM | POA: Diagnosis not present

## 2020-11-09 DIAGNOSIS — Z20822 Contact with and (suspected) exposure to covid-19: Secondary | ICD-10-CM | POA: Diagnosis not present

## 2020-11-09 DIAGNOSIS — Z3A38 38 weeks gestation of pregnancy: Secondary | ICD-10-CM | POA: Diagnosis not present

## 2020-11-09 DIAGNOSIS — O24420 Gestational diabetes mellitus in childbirth, diet controlled: Secondary | ICD-10-CM | POA: Diagnosis not present

## 2020-11-09 DIAGNOSIS — O9902 Anemia complicating childbirth: Secondary | ICD-10-CM | POA: Diagnosis not present

## 2020-11-09 HISTORY — DX: Gestational (pregnancy-induced) hypertension without significant proteinuria, unspecified trimester: O13.9

## 2020-11-09 LAB — CBC
HCT: 28.9 % — ABNORMAL LOW (ref 36.0–46.0)
Hemoglobin: 9.5 g/dL — ABNORMAL LOW (ref 12.0–15.0)
MCH: 25.1 pg — ABNORMAL LOW (ref 26.0–34.0)
MCHC: 32.9 g/dL (ref 30.0–36.0)
MCV: 76.5 fL — ABNORMAL LOW (ref 80.0–100.0)
Platelets: 301 10*3/uL (ref 150–400)
RBC: 3.78 MIL/uL — ABNORMAL LOW (ref 3.87–5.11)
RDW: 14.3 % (ref 11.5–15.5)
WBC: 15.2 10*3/uL — ABNORMAL HIGH (ref 4.0–10.5)
nRBC: 0 % (ref 0.0–0.2)

## 2020-11-09 LAB — CERVICOVAGINAL ANCILLARY ONLY
Bacterial Vaginitis (gardnerella): POSITIVE — AB
Candida Glabrata: NEGATIVE
Candida Vaginitis: POSITIVE — AB
Comment: NEGATIVE
Comment: NEGATIVE
Comment: NEGATIVE
Comment: NEGATIVE
Trichomonas: NEGATIVE

## 2020-11-09 LAB — RPR: RPR Ser Ql: NONREACTIVE

## 2020-11-09 LAB — GLUCOSE, CAPILLARY
Glucose-Capillary: 79 mg/dL (ref 70–99)
Glucose-Capillary: 79 mg/dL (ref 70–99)
Glucose-Capillary: 86 mg/dL (ref 70–99)
Glucose-Capillary: 109 mg/dL — ABNORMAL HIGH (ref 70–99)

## 2020-11-09 MED ORDER — FENTANYL-BUPIVACAINE-NACL 0.5-0.125-0.9 MG/250ML-% EP SOLN
EPIDURAL | Status: DC | PRN
  Administered 2020-11-09: 12 mL/h via EPIDURAL

## 2020-11-09 MED ORDER — PROMETHAZINE HCL 25 MG/ML IJ SOLN
12.5000 mg | Freq: Once | INTRAMUSCULAR | Status: AC
  Administered 2020-11-09: 12.5 mg via INTRAVENOUS
  Filled 2020-11-09: qty 1

## 2020-11-09 MED ORDER — TETANUS-DIPHTH-ACELL PERTUSSIS 5-2.5-18.5 LF-MCG/0.5 IM SUSY: 0.5000 mL | PREFILLED_SYRINGE | Freq: Once | INTRAMUSCULAR | Status: DC

## 2020-11-09 MED ORDER — ONDANSETRON HCL 4 MG/2ML IJ SOLN: 4.0000 mg | INTRAMUSCULAR | Status: DC | PRN

## 2020-11-09 MED ORDER — LACTATED RINGERS IV SOLN: 500.0000 mL | Freq: Once | INTRAVENOUS | Status: DC

## 2020-11-09 MED ORDER — PHENYLEPHRINE 40 MCG/ML (10ML) SYRINGE FOR IV PUSH (FOR BLOOD PRESSURE SUPPORT): 80.0000 ug | PREFILLED_SYRINGE | INTRAVENOUS | Status: DC | PRN

## 2020-11-09 MED ORDER — EPHEDRINE 5 MG/ML INJ: 10.0000 mg | INTRAVENOUS | Status: DC | PRN

## 2020-11-09 MED ORDER — VITAMIN C 250 MG PO TABS
250.0000 mg | ORAL_TABLET | ORAL | Status: DC
  Administered 2020-11-10: 250 mg via ORAL
  Filled 2020-11-09: qty 1

## 2020-11-09 MED ORDER — DIBUCAINE (PERIANAL) 1 % EX OINT: 1.0000 "application " | TOPICAL_OINTMENT | CUTANEOUS | Status: DC | PRN

## 2020-11-09 MED ORDER — OXYTOCIN BOLUS FROM INFUSION
333.0000 mL | Freq: Once | INTRAVENOUS | Status: AC
  Administered 2020-11-09: 333 mL via INTRAVENOUS

## 2020-11-09 MED ORDER — SENNOSIDES-DOCUSATE SODIUM 8.6-50 MG PO TABS
2.0000 | ORAL_TABLET | Freq: Every day | ORAL | Status: DC
  Administered 2020-11-10: 2 via ORAL
  Filled 2020-11-09: qty 2

## 2020-11-09 MED ORDER — PRENATAL MULTIVITAMIN CH
1.0000 | ORAL_TABLET | Freq: Every day | ORAL | Status: DC
  Administered 2020-11-10: 1 via ORAL
  Filled 2020-11-09: qty 1

## 2020-11-09 MED ORDER — FERROUS SULFATE 325 (65 FE) MG PO TABS
325.0000 mg | ORAL_TABLET | ORAL | Status: DC
  Administered 2020-11-10: 325 mg via ORAL
  Filled 2020-11-09: qty 1

## 2020-11-09 MED ORDER — DIPHENHYDRAMINE HCL 25 MG PO CAPS
25.0000 mg | ORAL_CAPSULE | Freq: Four times a day (QID) | ORAL | Status: DC | PRN
Start: 2020-11-09 — End: 2020-11-11

## 2020-11-09 MED ORDER — BENZOCAINE-MENTHOL 20-0.5 % EX AERO
1.0000 "application " | INHALATION_SPRAY | CUTANEOUS | Status: DC | PRN
  Administered 2020-11-09: 1 via TOPICAL
  Filled 2020-11-09 (×2): qty 56

## 2020-11-09 MED ORDER — ONDANSETRON HCL 4 MG PO TABS: 4.0000 mg | ORAL_TABLET | ORAL | Status: DC | PRN

## 2020-11-09 MED ORDER — MEDROXYPROGESTERONE ACETATE 150 MG/ML IM SUSP
150.0000 mg | Freq: Once | INTRAMUSCULAR | Status: AC
  Administered 2020-11-09: 150 mg via INTRAMUSCULAR
  Filled 2020-11-09: qty 1

## 2020-11-09 MED ORDER — FENTANYL-BUPIVACAINE-NACL 0.5-0.125-0.9 MG/250ML-% EP SOLN
12.0000 mL/h | EPIDURAL | Status: DC | PRN
  Filled 2020-11-09: qty 250

## 2020-11-09 MED ORDER — DIPHENHYDRAMINE HCL 50 MG/ML IJ SOLN: 12.5000 mg | INTRAMUSCULAR | Status: DC | PRN

## 2020-11-09 MED ORDER — LIDOCAINE HCL (PF) 1 % IJ SOLN
INTRAMUSCULAR | Status: DC | PRN
  Administered 2020-11-09: 2 mL via EPIDURAL
  Administered 2020-11-09: 10 mL via EPIDURAL

## 2020-11-09 MED ORDER — WITCH HAZEL-GLYCERIN EX PADS: 1.0000 "application " | MEDICATED_PAD | CUTANEOUS | Status: DC | PRN

## 2020-11-09 MED ORDER — ACETAMINOPHEN 325 MG PO TABS
650.0000 mg | ORAL_TABLET | ORAL | Status: DC
  Administered 2020-11-09 – 2020-11-11 (×8): 650 mg via ORAL
  Filled 2020-11-09 (×9): qty 2

## 2020-11-09 MED ORDER — BUTORPHANOL TARTRATE 1 MG/ML IJ SOLN
2.0000 mg | Freq: Once | INTRAMUSCULAR | Status: AC
  Administered 2020-11-09: 2 mg via INTRAVENOUS
  Filled 2020-11-09: qty 2

## 2020-11-09 MED ORDER — IBUPROFEN 600 MG PO TABS
600.0000 mg | ORAL_TABLET | Freq: Four times a day (QID) | ORAL | Status: DC
  Administered 2020-11-09 – 2020-11-10 (×2): 600 mg via ORAL
  Filled 2020-11-09 (×5): qty 1

## 2020-11-09 MED ORDER — COCONUT OIL OIL: 1.0000 "application " | TOPICAL_OIL | Status: DC | PRN

## 2020-11-09 MED ORDER — SIMETHICONE 80 MG PO CHEW: 80.0000 mg | CHEWABLE_TABLET | ORAL | Status: DC | PRN

## 2020-11-09 NOTE — Anesthesia Preprocedure Evaluation (Signed)
Anesthesia Evaluation  Patient identified by MRN, date of birth, ID band Patient awake    Reviewed: Allergy & Precautions, Patient's Chart, lab work & pertinent test results  Airway Mallampati: II  TM Distance: >3 FB Neck ROM: Full    Dental no notable dental hx.    Pulmonary neg pulmonary ROS,    Pulmonary exam normal breath sounds clear to auscultation       Cardiovascular hypertension (gest HTN), Normal cardiovascular exam Rhythm:Regular Rate:Normal     Neuro/Psych negative neurological ROS  negative psych ROS   GI/Hepatic negative GI ROS, Neg liver ROS,   Endo/Other  diabetes, Well Controlled, Gestational  Renal/GU negative Renal ROS  negative genitourinary   Musculoskeletal negative musculoskeletal ROS (+)   Abdominal   Peds  Hematology  (+) Blood dyscrasia, anemia , hct 28.9, plt 301   Anesthesia Other Findings ? 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, unlikely to be Hurler (most severe form)  Reproductive/Obstetrics (+) Pregnancy                             Anesthesia Physical Anesthesia Plan  ASA: III and emergent  Anesthesia Plan: Epidural   Post-op Pain Management:    Induction:   PONV Risk Score and Plan: 2  Airway Management Planned: Natural Airway  Additional Equipment: None  Intra-op Plan:   Post-operative Plan:   Informed Consent: I have reviewed the patients History and Physical, chart, labs and discussed the procedure including the risks, benefits and alternatives for the proposed anesthesia with the patient or authorized representative who has indicated his/her understanding and acceptance.       Plan Discussed with:   Anesthesia Plan Comments:         Anesthesia Quick Evaluation

## 2020-11-09 NOTE — Anesthesia Procedure Notes (Signed)
Epidural Patient location during procedure: OB Start time: 11/09/2020 11:36 AM End time: 11/09/2020 11:47 AM  Staffing Anesthesiologist: Lannie Fields, DO Performed: anesthesiologist   Preanesthetic Checklist Completed: patient identified, IV checked, risks and benefits discussed, monitors and equipment checked, pre-op evaluation and timeout performed  Epidural Patient position: sitting Prep: DuraPrep and site prepped and draped Patient monitoring: continuous pulse ox, blood pressure, heart rate and cardiac monitor Approach: midline Location: L3-L4 Injection technique: LOR air  Needle:  Needle type: Tuohy  Needle gauge: 17 G Needle length: 9 cm Needle insertion depth: 7 cm Catheter type: closed end flexible Catheter size: 19 Gauge Catheter at skin depth: 12 cm Test dose: negative  Assessment Sensory level: T8 Events: blood not aspirated, injection not painful, no injection resistance, no paresthesia and negative IV test  Additional Notes Patient identified. Risks/Benefits/Options discussed with patient including but not limited to bleeding, infection, nerve damage, paralysis, failed block, incomplete pain control, headache, blood pressure changes, nausea, vomiting, reactions to medication both or allergic, itching and postpartum back pain. Confirmed with bedside nurse the patient's most recent platelet count. Confirmed with patient that they are not currently taking any anticoagulation, have any bleeding history or any family history of bleeding disorders. Patient expressed understanding and wished to proceed. All questions were answered. Sterile technique was used throughout the entire procedure. Please see nursing notes for vital signs. Test dose was given through epidural catheter and negative prior to continuing to dose epidural or start infusion. Warning signs of high block given to the patient including shortness of breath, tingling/numbness in hands, complete motor  block, or any concerning symptoms with instructions to call for help. Patient was given instructions on fall risk and not to get out of bed. All questions and concerns addressed with instructions to call with any issues or inadequate analgesia.  Reason for block:procedure for pain

## 2020-11-09 NOTE — Progress Notes (Signed)
Inpatient Diabetes Program Recommendations  Diabetes Treatment Program Recommendations  ADA Standards of Care  Diabetes in Pregnancy Target Glucose Ranges:  Fasting: 60 - 90 mg/dL Preprandial: 60 - 540 mg/dL 1 hr postprandial: Less than 140mg /dL (from first bite of meal) 2 hr postprandial: Less than 120 mg/dL (from first bite of meal)   Results for KHADEEJA, ELDEN (MRN Gar Gibbon) as of 11/09/2020 09:11  Ref. Range 11/08/2020 16:55 11/08/2020 21:48 11/09/2020 02:19 11/09/2020 06:09  Glucose-Capillary Latest Ref Range: 70 - 99 mg/dL 78 98 79 79  Results for ZAYAH, KEILMAN (MRN Gar Gibbon) as of 11/09/2020 09:11  Ref. Range 08/27/2020 10:33  Hemoglobin A1C Latest Ref Range: 4.8 - 5.6 % 4.8   Review of Glycemic Control  Diabetes history: GDM; [redacted]W[redacted]D Outpatient Diabetes medications: None Current orders for Inpatient glycemic control: CBGs Q4H  NOTE: Noted consult for diabetes coordinator. Chart reviewed. Per chart, patient has GDM, [redacted]W[redacted]D gestation, and currently in labor. CBGs ordered Q4H and have ranged from 78-98 mg/dl over the past 16 hours. Agree with current orders at this time. Will continue to follow along while inpatient.   Thanks, 14/11/2019, RN, MSN, CDE Diabetes Coordinator Inpatient Diabetes Program [phone] (Team Pager from 8am to 5pm)

## 2020-11-09 NOTE — Progress Notes (Signed)
Labor Progress Note Paula Nunez is a 20 y.o. G1P0 at [redacted]w[redacted]d presented for IOL for A1GDM S: Patient is resting comfortably without any complaints   O:  BP (!) 146/88   Pulse 98   Temp 98.1 F (36.7 C) (Oral)   Resp 16   Ht 5\' 5"  (1.651 m)   Wt 88.9 kg   LMP 02/11/2020 (Approximate)   SpO2 99%   BMI 32.63 kg/m  EFM: baseline HR 140bpm/mod variability/accels present, no decels  CVE: Dilation: 8 Effacement (%): 80 Cervical Position: Posterior Station: 0 Presentation: Vertex Exam by:: [email or web address], DO   A&P: 20 y.o. G1P0 [redacted]w[redacted]d presented for IOL for A1GDM. #Labor: Progressing well. FB out 2/15 @ 2200. S/p SROM. Currently on 12 of Pitocin. Will continue to titrate as clinically indicated.  #Pain: epidural  #FWB: Cat 1 strip  #GBS positive , Tx PCN  #A1GDM: CBG 86. Check CBG q 4 hours in latent labor, and q 2h in active labor  #gHTN: Patient is asymptomatic. No severe range pressures PreE labs nml, UP:C 0.18. Continue to monitor  #Trichomoniasis: Cervicovaginal ancillary (+) 10/22/20- patient reported completing treatment course.  #Anemia in pregnancy:Most recent Hgb noted at 9.6, to consider use of PO vs IV iron post-partum.  #Hurler Syndrome (HCC) #Alpha thalassemia silent carrier  10/24/20, DO [facility] 12:35 PM

## 2020-11-09 NOTE — Discharge Summary (Signed)
Postpartum Discharge Summary  Date of Service updated 11/10/20     Patient Name: Paula Nunez DOB: September 30, 2000 MRN: [id number]  Date of admission: 11/08/2020 Delivery date:11/09/2020  Delivering provider: Shary Key  Date of discharge: 11/10/2020  Admitting diagnosis: Gestational diabetes [O24.419] Gestational hypertension [O13.9] Intrauterine pregnancy: [redacted]w[redacted]d    Secondary diagnosis:  Active Problems:   Supervision of normal first pregnancy, antepartum   Anemia in pregnancy, second trimester   Alpha thalassemia silent carrier   Trichomoniasis   Genetic carrier   Gestational diabetes   Gestational hypertension   Positive GBS test   Vaginal delivery  Additional problems: none    Discharge diagnosis: Gestational Hypertension, GDM A1 and Anemia                                              Post partum procedures:none Augmentation: Pitocin, Cytotec and IP Foley Complications: None  Hospital course: Induction of Labor With Vaginal Delivery   20y.o. yo G1P0 at 33w6das admitted to the hospital 11/08/2020 for induction of labor.  Indication for induction: Gestational hypertension dx during her office visit on 2/14. Her pre-e labs were neg on admission and her BPs remained mildly elevated in labor and she was asymptomatic. She also had GDMA1. Of note, she had a neg trich TOC swab on 2/14 as well. She went through the usual cervical ripening methods, progressing to vag del on the afternoon of 2/15.  Membrane Rupture Time/Date: 10:22 AM ,11/09/2020   Delivery Method:Vaginal, Spontaneous  Episiotomy: None  Lacerations:  1st degree  Details of delivery can be found in separate delivery note.  Patient had a routine postpartum course. Her fasting CBG on PPD#1 was 109 . Her BPs were well controlled. Patient is discharged home 11/10/20.  Newborn Data: Birth date:11/09/2020  Birth time:2:28 PM  Gender:Female  Living status:Living  Apgars:9 ,10  Weight:3209 g (7lb  1.2oz)  Magnesium Sulfate received: No BMZ received: No Rhophylac:N/A MMR:N/A T-DaP:Given prenatally Flu: Yes Transfusion:No  Physical exam  Vitals:   11/09/20 1745 11/09/20 2145 11/10/20 0119 11/10/20 0532  BP: 131/62 129/73 (!) 122/59 127/66  Pulse: 67 84 86 72  Resp: 18  16 14   Temp: 98.2 F (36.8 C) (!) 97.3 F (36.3 C) 98.2 F (36.8 C) 97.7 F (36.5 C)  TempSrc: Oral Oral Oral Oral  SpO2: 99% 99% 99% 99%  Weight:      Height:       General: alert, cooperative and no distress Lochia: appropriate Uterine Fundus: firm Incision: N/A DVT Evaluation: No evidence of DVT seen on physical exam. Labs: Lab Results  Component Value Date   WBC 15.2 (H) 11/09/2020   HGB 9.5 (L) 11/09/2020   HCT 28.9 (L) 11/09/2020   MCV 76.5 (L) 11/09/2020   PLT 301 11/09/2020   CMP Latest Ref Rng & Units 11/08/2020  Glucose 70 - 99 mg/dL 76  BUN 6 - 20 mg/dL 10  Creatinine 0.44 - 1.00 mg/dL 0.57  Sodium 135 - 145 mmol/L 135  Potassium 3.5 - 5.1 mmol/L 3.2(L)  Chloride 98 - 111 mmol/L 104  CO2 22 - 32 mmol/L 20(L)  Calcium 8.9 - 10.3 mg/dL 9.1  Total Protein 6.5 - 8.1 g/dL 6.9  Total Bilirubin 0.3 - 1.2 mg/dL 0.8  Alkaline Phos 38 - 126 U/L 217(H)  AST 15 - 41 U/L 15  ALT 0 - 44 U/L 8   Edinburgh Score: Edinburgh Postnatal Depression Scale Screening Tool 11/09/2020  I have been able to laugh and see the funny side of things. (No Data)     After visit meds:  Allergies as of 11/10/2020   No Known Allergies     Medication List    STOP taking these medications   Accu-Chek Guide Me w/Device Kit   Accu-Chek Guide test strip Generic drug: glucose blood   Accu-Chek Softclix Lancets lancets   Gojji Weight Scale Misc   ondansetron 4 MG disintegrating tablet Commonly known as: ZOFRAN-ODT   Vitafol Gummies 3.33-0.333-34.8 MG Chew     TAKE these medications   acetaminophen 500 MG tablet Commonly known as: TYLENOL Take 2 tablets (1,000 mg total) by mouth every 6 (six)  hours as needed.   ascorbic acid 250 MG tablet Commonly known as: VITAMIN C Take 1 tablet (250 mg total) by mouth every other day. Take with iron   Blood Pressure Monitor Automat Devi 1 Device by Does not apply route daily. Automatic blood pressure cuff regular size. To monitor blood pressure regularly at home. ICD-10 code:Z34.90   ferrous sulfate 325 (65 FE) MG EC tablet Take 1 tablet (325 mg total) by mouth every other day.   ibuprofen 600 MG tablet Commonly known as: ADVIL Take 1 tablet (600 mg total) by mouth every 6 (six) hours as needed.        Discharge home in stable condition Infant Feeding: Bottle Infant Disposition:home with mother Discharge instruction: per After Visit Summary and Postpartum booklet. Activity: Advance as tolerated. Pelvic rest for 6 weeks.  Diet: routine diet Future Appointments: Future Appointments  Date Time Provider [place]  12/22/2020  1:10 PM Gabriel Carina, CNM [facility] None   Follow up Visit:  Myrtis Ser, CNM  P [facility] Admin Please schedule this patient for Postpartum visit in: BP check in 1 wk; PP visit in 4wks with the following provider: Any provider  In-Person  For C/S patients schedule nurse incision check in weeks 2 weeks: no  High risk pregnancy complicated by: gHTN and GDMA1  Delivery mode: SVD  Anticipated Birth Control: Depo (unsure if will get in hospital)  PP Procedures needed: BP check 1 wk; GTT in 4-6wks  Schedule Integrated [place] visit: no  1/97/5883 Arrie Senate, MD

## 2020-11-09 NOTE — Plan of Care (Signed)

## 2020-11-09 NOTE — Progress Notes (Signed)
Patient ID: Maridee Slape, female   DOB: Feb 04, 2001, 20 y.o.   MRN: [id number]  Doing well w ctx; has gotten multiple doses of Fentanyl; would like an epidural later on  BPs 141/72, 140/63 FHR 140s, +accels, no decels Ctx q 2-3 mins with Pit 14mu/min Cx deferred (was 4+/50/vtx -2 per RN at last exam 0600)  CBG 79  IUP@38 .6wks gHTN- mild range, labs neg GDMA1- EFW 34th% IOL process  -Will give a dose of Stadol/Phenergan to hopefully give some therapeutic rest, and then plan a cx eval after it has worn off -Keep ctx reg w Pitocin -Anticipate vag del  Arabella [facility] 11/09/2020 9:32 AM

## 2020-11-10 ENCOUNTER — Inpatient Hospital Stay (HOSPITAL_COMMUNITY): Admission: AD | Admit: 2020-11-10 | Payer: Medicaid Other | Source: Home / Self Care | Admitting: Family Medicine

## 2020-11-10 ENCOUNTER — Inpatient Hospital Stay (HOSPITAL_COMMUNITY): Payer: Medicaid Other

## 2020-11-10 MED ORDER — ACETAMINOPHEN 500 MG PO TABS: 1000.0000 mg | ORAL_TABLET | Freq: Four times a day (QID) | ORAL | Status: DC | PRN

## 2020-11-10 MED ORDER — ASCORBIC ACID 250 MG PO TABS: 250.0000 mg | ORAL_TABLET | ORAL | Status: DC

## 2020-11-10 MED ORDER — IBUPROFEN 600 MG PO TABS: 600.0000 mg | ORAL_TABLET | Freq: Four times a day (QID) | ORAL | 0 refills | Status: DC | PRN

## 2020-11-10 NOTE — Anesthesia Postprocedure Evaluation (Signed)
Anesthesia Post Note  Patient: Paula Nunez  Procedure(s) Performed: AN AD HOC LABOR EPIDURAL     Patient location during evaluation: Mother Baby Anesthesia Type: Epidural Level of consciousness: awake, awake and alert and oriented Pain management: pain level controlled Vital Signs Assessment: post-procedure vital signs reviewed and stable Respiratory status: spontaneous breathing, nonlabored ventilation and respiratory function stable Cardiovascular status: stable Postop Assessment: no headache, patient able to bend at knees, no apparent nausea or vomiting, adequate PO intake, able to ambulate and no backache Anesthetic complications: no   No complications documented.  Last Vitals:  Vitals:   11/10/20 0119 11/10/20 0532  BP: (!) 122/59 127/66  Pulse: 86 72  Resp: 16 14  Temp: 36.8 C 36.5 C  SpO2: 99% 99%    Last Pain:  Vitals:   11/10/20 0532  TempSrc: Oral  PainSc: 0-No pain   Pain Goal:                   Paula Nunez

## 2020-11-10 NOTE — Discharge Instructions (Signed)
-take tylenol 1000 mg every 6 hours as needed for pain, alternate with ibuprofen 600 mg every 6 hours -take iron pills every other day with vitamin c, this will help healing as well as breast feeding -think about birth control options-->bedisider.org is a great website! You can get any form of birth control from the health department for free   Postpartum Care After Vaginal Delivery The following information offers guidance about how to care for yourself from the time you deliver your baby to 6-12 weeks after delivery (postpartum period). If you have problems or questions, contact your health care provider for more specific instructions. Follow these instructions at home: Vaginal bleeding  It is normal to have vaginal bleeding (lochia) after delivery. Wear a sanitary pad for bleeding and discharge. ? During the first week after delivery, the amount and appearance of lochia is often similar to a menstrual period. ? Over the next few weeks, it will gradually decrease to a dry, yellow-brown discharge. ? For most women, lochia stops completely by 4-6 weeks after delivery, but can vary.  Change your sanitary pads frequently. Watch for any changes in your flow, such as: ? A sudden increase in volume. ? A change in color. ? Large blood clots.  If you pass a blood clot from your vagina, save it and call your health care provider. Do not flush blood clots down the toilet before talking with your health care provider.  Do not use tampons or douches until your health care provider approves.  If you are not breastfeeding, your period should return 6-8 weeks after delivery. If you are feeding your baby breast milk only, your period may not return until you stop breastfeeding. Perineal care  Keep the area between the vagina and the anus (perineum) clean and dry. Use medicated pads and pain-relieving sprays and creams as directed.  If you had a surgical cut in the perineum (episiotomy) or a tear, check  the area for signs of infection until you are healed. Check for: ? More redness, swelling, or pain. ? Fluid or blood coming from the cut or tear. ? Warmth. ? Pus or a bad smell.  You may be given a squirt bottle to use instead of wiping to clean the perineum area after you use the bathroom. Pat the area gently to dry it.  To relieve pain caused by an episiotomy, a tear, or swollen veins in the anus (hemorrhoids), take a warm sitz bath 2-3 times a day. In a sitz bath, the warm water should only come up to your hips and cover your buttocks.   Breast care  In the first few days after delivery, your breasts may feel heavy, full, and uncomfortable (breast engorgement). Milk may also leak from your breasts. Ask your health care provider about ways to help relieve the discomfort.  If you are breastfeeding: ? Wear a bra that supports your breasts and fits well. Use breast pads to absorb milk that leaks. ? Keep your nipples clean and dry. Apply creams and ointments as told. ? You may have uterine contractions every time you breastfeed for up to several weeks after delivery. This helps your uterus return to its normal size. ? If you have any problems with breastfeeding, notify your health care provider or lactation consultant.  If you are not breastfeeding: ? Avoid touching your breasts. Do not squeeze out (express) milk. Doing this can make your breasts produce more milk. ? Wear a good-fitting bra and use cold packs to help  with swelling. Intimacy and sexuality  Ask your health care provider when you can engage in sexual activity. This may depend upon: ? Your risk of infection. ? How fast you are healing. ? Your comfort and desire to engage in sexual activity.  You are able to get pregnant after delivery, even if you have not had your period. Talk with your health care provider about methods of birth control (contraception) or family planning if you desire future pregnancies. Medicines  Take  over-the-counter and prescription medicines only as told by your health care provider.  Take an over-the-counter stool softener to help ease bowel movements as told by your health care provider.  If you were prescribed an antibiotic medicine, take it as told by your health care provider. Do not stop taking the antibiotic even if you start to feel better.  Review all previous and current prescriptions to check for possible transfer into breast milk. Activity  Gradually return to your normal activities as told by your health care provider.  Rest as much as possible. Nap while your baby is sleeping. Eating and drinking  Drink enough fluid to keep your urine pale yellow.  To help prevent or relieve constipation, eat high-fiber foods every day.  Choose healthy eating to support breastfeeding or weight loss goals.  Take your prenatal vitamins until your health care provider tells you to stop.   General tips/recommendations  Do not use any products that contain nicotine or tobacco. These products include cigarettes, chewing tobacco, and vaping devices, such as e-cigarettes. If you need help quitting, ask your health care provider.  Do not drink alcohol, especially if you are breastfeeding.  Do not take medications or drugs that are not prescribed to you, especially if you are breastfeeding.  Visit your health care provider for a postpartum checkup within the first 3-6 weeks after delivery.  Complete a comprehensive postpartum visit no later than 12 weeks after delivery.  Keep all follow-up visits for you and your baby. Contact a health care provider if:  You feel unusually sad or worried.  Your breasts become red, painful, or hard.  You have a fever or other signs of an infection.  You have bleeding that is soaking through one pad an hour or you have blood clots.  You have a severe headache that doesn't go away or you have vision changes.  You have nausea and vomiting and are  unable to eat or drink anything for 24 hours. Get help right away if:  You have chest pain or difficulty breathing.  You have sudden, severe leg pain.  You faint or have a seizure.  You have thoughts about hurting yourself or your baby. If you ever feel like you may hurt yourself or others, or have thoughts about taking your own life, get help right away. Go to your nearest emergency department or:  Call your local emergency services (911 in the [place]  The National Suicide Prevention Lifeline at 725-366-9456. This suicide crisis helpline is open 24 hours a day.  Text the Crisis Text Line at (208)788-0657 (in the [place] Summary  The period of time after you deliver your newborn up to 6-12 weeks after delivery is called the postpartum period.  Keep all follow-up visits for you and your baby.  Review all previous and current prescriptions to check for possible transfer into breast milk.  Contact a health care provider if you feel unusually sad or worried during the postpartum period. This information is not intended to replace  advice given to you by your health care provider. Make sure you discuss any questions you have with your health care provider. Document Revised: 05/27/2020 Document Reviewed: 05/27/2020 Elsevier Patient Education  2021 [employer].

## 2020-11-10 NOTE — Clinical Social Work Maternal (Signed)
CLINICAL SOCIAL WORK MATERNAL/CHILD NOTE  Patient Details  Name: Paula Nunez MRN: [id number] Date of Birth: 04/09/2001  Date:  11/10/2020  Clinical Social Worker Initiating Note:  Kierstan Auer, MSW, LCSWA Date/Time: Initiated:  11/10/20/1100     Child's Name:  Paula Nunez   Biological Parents:  Mother   Need for Interpreter:  None   Reason for Referral:  Current Substance Use/Substance Use During Pregnancy    Address:  [address] [place] [address]    Phone number:  [phone] (home) [phone] (work)    Additional phone number:   Household Members/Support Persons (HM/SP):   Household Member/Support Person 1   HM/SP Name Relationship DOB or Age  HM/SP -1 Paula Nunez Grandmother 05/27/1953  HM/SP -2        HM/SP -3        HM/SP -4        HM/SP -5        HM/SP -6        HM/SP -7        HM/SP -8          Natural Supports (not living in the home):  Immediate Family   Professional Supports: None   Employment: Unemployed   Type of Work:     Education:  High school graduate   Homebound arranged:    Financial Resources:  Medicaid   Other Resources:  WIC   Cultural/Religious Considerations Which May Impact Care:    Strengths:  Ability to meet basic needs ,Home prepared for child    Psychotropic Medications:         Pediatrician:       Pediatrician List:       [place]    [place]    [place]    [place]    [place]      Pediatrician Fax Number:    Risk Factors/Current Problems:  Substance Use    Cognitive State:  Linear Thinking ,Insightful ,Alert    Mood/Affect:  Happy ,Interested ,Comfortable ,Bright    CSW Assessment: CSW consulted for THC use in pregnancy. CSW met with MOB to complete assessment and offer support. CSW observed MOB holding newborn 'Damrris'. CSW introduced self and role. MOB was open and engaged throughout assessment. MOB appeared to be in a  bright mood, laughing and smiling. CSW informed MOB of the reason for consult. MOB reported she last used THC in January to assist with appetite. MOB denies using additional substances. CSW informed MOB of the hospital drug screen policy. MOB was informed infant's UDS is negative and CDS will be followed. MOB aware a CPS report will be made if infant test positive for substances. MOB was understanding.   MOB reported she lives with her grandmother. FOB will not be involved. MOB receives WIC resources. MOB disclosed experiencing anxiety during pregnancy, although she has never been diagnosed. MOB reported she has experienced anxiety for years. CSW discussed coping mechanisms with MOB. MOB stated she typically listens to music to cope. MOB reported she is currently feeling perfectly fine, and was observed smiling at infant. MOB denies any current SI, HI or DV. MOB identified her grandmother as a support.   CSW provided education regarding the baby blues period vs. perinatal mood disorders, discussed treatment and gave resources for mental health follow up if concerns arise.  CSW recommends self-evaluation during the postpartum time period using the New Mom Checklist from Postpartum Progress and encouraged MOB to contact a medical professional   if symptoms are noted at any time.   CSW provided review of Sudden Infant Death Syndrome (SIDS) precautions. MOB reported she has all essential needs for baby. MOB is still choosing a pediatrician and denies any barriers to care. MOB declined any referrals to community supports.    CSW will continue to follow CDS and make a CPS report if necessary. CSW identifies no further need for intervention and no barriers to discharge at this time.  CSW Plan/Description:  No Further Intervention Required/No Barriers to Discharge,Perinatal Mood and Anxiety Disorder (PMADs) Education,Hospital Drug Screen Policy Information,Child Protective Service Report ,CSW Will Continue to  Monitor Umbilical Cord Tissue Drug Screen Results and Make Report if Warranted,Sudden Infant Death Syndrome (SIDS) Education,Other Information/Referral to Community Resources    Orlo Brickle J Shrita Thien, LCSWA 11/10/2020, 11:15 AM 

## 2020-11-11 NOTE — Progress Notes (Signed)
Patient showed interest in pumping around 0600 on 11/11/2020. She stated that she desires to pump. Hand pump was given and patient was educated on pump parts, how to put it together, and how to pump. Patient desired lactation consult before being discharged. Lactation order in

## 2020-11-11 NOTE — Progress Notes (Signed)
Advised patient not to sleep with infant in bed. Patient was asleep with baby in bed with her during two separate rounds during shift. Patient stated that that's the only way he will stay calm, but I advised her of the increased risk of infant suffocation and that the baby should sleep in the crib on his back, alone, with no extra blankets or other items.

## 2020-11-11 NOTE — Discharge Summary (Signed)
Postpartum Discharge Summary  Date of Service updated 11/10/20     Patient Name: Paula Nunez DOB: [birth year] MRN: [id number]  Date of admission: 11/08/2020 Delivery date:11/09/2020  Delivering provider: Shary Key  Date of discharge: 11/11/2020  Admitting diagnosis: Gestational diabetes [O24.419] Gestational hypertension [O13.9] Intrauterine pregnancy: [redacted]w[redacted]d    Secondary diagnosis:  Active Problems:   Supervision of normal first pregnancy, antepartum   Anemia in pregnancy, second trimester   Alpha thalassemia silent carrier   Trichomoniasis   Genetic carrier   Gestational diabetes   Gestational hypertension   Positive GBS test   Vaginal delivery  Additional problems: none    Discharge diagnosis: Gestational Hypertension, GDM A1 and Anemia                                              Post partum procedures:none Augmentation: Pitocin, Cytotec and IP Foley Complications: None  Hospital course: Induction of Labor With Vaginal Delivery   20y.o. yo G1P0 at 384w6das admitted to the hospital 11/08/2020 for induction of labor.  Indication for induction: Gestational hypertension dx during her office visit on 2/14. Her pre-e labs were neg on admission and her BPs remained mildly elevated in labor and she was asymptomatic. She also had GDMA1. Of note, she had a neg trich TOC swab on 2/14 as well. She went through the usual cervical ripening methods, progressing to vag del on the afternoon of 2/15.  Membrane Rupture Time/Date: 10:22 AM ,11/09/2020   Delivery Method:Vaginal, Spontaneous  Episiotomy: None  Lacerations:  1st degree  Details of delivery can be found in separate delivery note.  Patient had a routine postpartum course. Her fasting CBG on PPD#1 was 109 . Her BPs were well controlled. Patient is discharged home 11/11/20.  Newborn Data: Birth date:11/09/2020  Birth time:2:28 PM  Gender:Female  Living status:Living  Apgars:9 ,10  Weight:3209 g (7lb  1.2oz)  Magnesium Sulfate received: No BMZ received: No Rhophylac:N/A MMR:N/A T-DaP:Given prenatally Flu: Yes Transfusion:No  Physical exam  Vitals:   11/10/20 0532 11/10/20 1435 11/10/20 2132 11/11/20 0627  BP: 127/66 122/69 135/62 (!) 121/57  Pulse: 72 63 68 64  Resp: 14 17 18 15   Temp: 97.7 F (36.5 C) 98 F (36.7 C) 98.2 F (36.8 C) 98.6 F (37 C)  TempSrc: Oral Oral Oral Oral  SpO2: 99% 99% 98% 100%  Weight:      Height:       General: alert, cooperative and no distress Lochia: appropriate Uterine Fundus: firm Incision: N/A DVT Evaluation: No evidence of DVT seen on physical exam. Labs: Lab Results  Component Value Date   WBC 15.2 (H) 11/09/2020   HGB 9.5 (L) 11/09/2020   HCT 28.9 (L) 11/09/2020   MCV 76.5 (L) 11/09/2020   PLT 301 11/09/2020   CMP Latest Ref Rng & Units 11/08/2020  Glucose 70 - 99 mg/dL 76  BUN 6 - 20 mg/dL 10  Creatinine 0.44 - 1.00 mg/dL 0.57  Sodium 135 - 145 mmol/L 135  Potassium 3.5 - 5.1 mmol/L 3.2(L)  Chloride 98 - 111 mmol/L 104  CO2 22 - 32 mmol/L 20(L)  Calcium 8.9 - 10.3 mg/dL 9.1  Total Protein 6.5 - 8.1 g/dL 6.9  Total Bilirubin 0.3 - 1.2 mg/dL 0.8  Alkaline Phos 38 - 126 U/L 217(H)  AST 15 - 41 U/L 15  ALT 0 - 44 U/L 8   Edinburgh Score: Edinburgh Postnatal Depression Scale Screening Tool 11/10/2020  I have been able to laugh and see the funny side of things. (No Data)     After visit meds:  Allergies as of 11/11/2020   No Known Allergies     Medication List    STOP taking these medications   Accu-Chek Guide Me w/Device Kit   Accu-Chek Guide test strip Generic drug: glucose blood   Accu-Chek Softclix Lancets lancets   Gojji Weight Scale Misc   ondansetron 4 MG disintegrating tablet Commonly known as: ZOFRAN-ODT   Vitafol Gummies 3.33-0.333-34.8 MG Chew     TAKE these medications   acetaminophen 500 MG tablet Commonly known as: TYLENOL Take 2 tablets (1,000 mg total) by mouth every 6 (six) hours as  needed.   ascorbic acid 250 MG tablet Commonly known as: VITAMIN C Take 1 tablet (250 mg total) by mouth every other day. Take with iron   Blood Pressure Monitor Automat Devi 1 Device by Does not apply route daily. Automatic blood pressure cuff regular size. To monitor blood pressure regularly at home. ICD-10 code:Z34.90   ferrous sulfate 325 (65 FE) MG EC tablet Take 1 tablet (325 mg total) by mouth every other day.   ibuprofen 600 MG tablet Commonly known as: ADVIL Take 1 tablet (600 mg total) by mouth every 6 (six) hours as needed.        Discharge home in stable condition Infant Feeding: Bottle Infant Disposition:home with mother Discharge instruction: per After Visit Summary and Postpartum booklet. Activity: Advance as tolerated. Pelvic rest for 6 weeks.  Diet: routine diet Future Appointments: Future Appointments  Date Time Provider [place]  11/16/2020 10:30 AM [address] None  12/22/2020  8:30 AM Gabriel Carina, CNM [facility] None   Follow up Visit:  Myrtis Ser, CNM  P [facility] Admin Please schedule this patient for Postpartum visit in: BP check in 1 wk; PP visit in 4wks with the following provider: Any provider  In-Person  For C/S patients schedule nurse incision check in weeks 2 weeks: no  High risk pregnancy complicated by: gHTN and GDMA1  Delivery mode: SVD  Anticipated Birth Control: Depo (unsure if will get in hospital)  PP Procedures needed: BP check 1 wk; GTT in 4-6wks  Schedule Integrated [place] visit: no  10/05/347 Arrie Senate, MD

## 2020-11-11 NOTE — Progress Notes (Signed)
Post Partum Day 1 Subjective:  Patient is doing well without complaints. Ambulating without difficulty. Voiding and passing flatus. Tolerating PO. Abdominal pain improved. Vaginal bleeding decreased.  Objective: Blood pressure (!) 121/57, pulse 64, temperature 98.6 F (37 C), temperature source Oral, resp. rate 15, height 5\' 5"  (1.651 m), weight 88.9 kg, last menstrual period 02/11/2020, SpO2 100 %, unknown if currently breastfeeding.  Physical Exam:  General: alert, cooperative and no distress Lochia: appropriate Uterine Fundus: firm Incision: n/a DVT Evaluation: No evidence of DVT seen on physical exam.  Recent Labs    11/08/20 1621 11/09/20 1050  HGB 9.6* 9.5*  HCT 30.7* 28.9*    Assessment/Plan: PPD#1   -meeting pp milestones  -VSS  -baby needs circ, consented  -depo ordered  #gHTN  -BP well controlled  -asymptomatic  Plan for discharge tomorrow.     LOS: 3 days   11/11/20 11/11/2020, 7:19 AM

## 2020-11-16 ENCOUNTER — Other Ambulatory Visit: Payer: Self-pay

## 2020-11-16 ENCOUNTER — Ambulatory Visit (INDEPENDENT_AMBULATORY_CARE_PROVIDER_SITE_OTHER): Payer: Medicaid Other | Admitting: *Deleted

## 2020-11-16 VITALS — BP 128/82 | HR 108 | Temp 98.3°F | Ht 65.0 in | Wt 177.6 lb

## 2020-11-16 DIAGNOSIS — Z0130 Encounter for examination of blood pressure without abnormal findings: Secondary | ICD-10-CM

## 2020-11-16 NOTE — Progress Notes (Signed)
   Subjective:  Paula Nunez is a 20 y.o. female here for BP check.   Hypertension ROS: no TIA's, no chest pain on exertion, no dyspnea on exertion, no swelling of ankles, no orthostatic dizziness or lightheadedness, no orthopnea or paroxysmal nocturnal dyspnea and no palpitations.    Objective:  BP 128/82 (BP Location: Left Arm, Patient Position: Sitting, Cuff Size: Normal)   Pulse (!) 108   Temp 98.3 F (36.8 C) (Oral)   Ht 5\' 5"  (1.651 m)   Wt 177 lb 9.6 oz (80.6 kg)   LMP 02/11/2020 (Approximate)   Breastfeeding No   BMI 29.55 kg/m   Appearance alert, well appearing, and in no distress, oriented to person, place, and time and normal appearing weight. General exam BP noted to be well controlled today in office.    Assessment:   Blood Pressure well controlled, stable and asymptomatic.   Plan:  Current treatment plan is effective, no change in therapy.   02/13/2020, RN

## 2020-12-22 ENCOUNTER — Encounter: Payer: Self-pay | Admitting: Certified Nurse Midwife

## 2020-12-22 ENCOUNTER — Ambulatory Visit (INDEPENDENT_AMBULATORY_CARE_PROVIDER_SITE_OTHER): Payer: Medicaid Other | Admitting: Certified Nurse Midwife

## 2020-12-22 ENCOUNTER — Ambulatory Visit: Payer: Medicaid Other | Admitting: Certified Nurse Midwife

## 2020-12-22 ENCOUNTER — Other Ambulatory Visit: Payer: Self-pay

## 2020-12-22 DIAGNOSIS — R030 Elevated blood-pressure reading, without diagnosis of hypertension: Secondary | ICD-10-CM | POA: Insufficient documentation

## 2020-12-22 DIAGNOSIS — Z3042 Encounter for surveillance of injectable contraceptive: Secondary | ICD-10-CM

## 2020-12-22 MED ORDER — MEDROXYPROGESTERONE ACETATE 150 MG/ML IM SUSP
150.0000 mg | INTRAMUSCULAR | 3 refills | Status: DC
Start: 2020-12-22 — End: 2021-11-26

## 2020-12-22 NOTE — Patient Instructions (Signed)
Managing Your Hypertension Hypertension, also called high blood pressure, is when the force of the blood pressing against the walls of the arteries is too strong. Arteries are blood vessels that carry blood from your heart throughout your body. Hypertension forces the heart to work harder to pump blood and may cause the arteries to become narrow or stiff. Understanding blood pressure readings Your personal target blood pressure may vary depending on your medical conditions, your age, and other factors. A blood pressure reading includes a higher number over a lower number. Ideally, your blood pressure should be below 120/80. You should know that:  The first, or top, number is called the systolic pressure. It is a measure of the pressure in your arteries as your heart beats.  The second, or bottom number, is called the diastolic pressure. It is a measure of the pressure in your arteries as the heart relaxes. Blood pressure is classified into four stages. Based on your blood pressure reading, your health care provider may use the following stages to determine what type of treatment you need, if any. Systolic pressure and diastolic pressure are measured in a unit called mmHg. Normal  Systolic pressure: below 120.  Diastolic pressure: below 80. Elevated  Systolic pressure: 120-129.  Diastolic pressure: below 80. Hypertension stage 1  Systolic pressure: 130-139.  Diastolic pressure: 80-89. Hypertension stage 2  Systolic pressure: 140 or above.  Diastolic pressure: 90 or above. How can this condition affect me? Managing your hypertension is an important responsibility. Over time, hypertension can damage the arteries and decrease blood flow to important parts of the body, including the brain, heart, and kidneys. Having untreated or uncontrolled hypertension can lead to:  A heart attack.  A stroke.  A weakened blood vessel (aneurysm).  Heart failure.  Kidney damage.  Eye  damage.  Metabolic syndrome.  Memory and concentration problems.  Vascular dementia. What actions can I take to manage this condition? Hypertension can be managed by making lifestyle changes and possibly by taking medicines. Your health care provider will help you make a plan to bring your blood pressure within a normal range. Nutrition  Eat a diet that is high in fiber and potassium, and low in salt (sodium), added sugar, and fat. An example eating plan is called the Dietary Approaches to Stop Hypertension (DASH) diet. To eat this way: ? Eat plenty of fresh fruits and vegetables. Try to fill one-half of your plate at each meal with fruits and vegetables. ? Eat whole grains, such as whole-wheat pasta, brown rice, or whole-grain bread. Fill about one-fourth of your plate with whole grains. ? Eat low-fat dairy products. ? Avoid fatty cuts of meat, processed or cured meats, and poultry with skin. Fill about one-fourth of your plate with lean proteins such as fish, chicken without skin, beans, eggs, and tofu. ? Avoid pre-made and processed foods. These tend to be higher in sodium, added sugar, and fat.  Reduce your daily sodium intake. Most people with hypertension should eat less than 1,500 mg of sodium a day.   Lifestyle  Work with your health care provider to maintain a healthy body weight or to lose weight. Ask what an ideal weight is for you.  Get at least 30 minutes of exercise that causes your heart to beat faster (aerobic exercise) most days of the week. Activities may include walking, swimming, or biking.  Include exercise to strengthen your muscles (resistance exercise), such as weight lifting, as part of your weekly exercise routine. Try   to do these types of exercises for 30 minutes at least 3 days a week.  Do not use any products that contain nicotine or tobacco, such as cigarettes, e-cigarettes, and chewing tobacco. If you need help quitting, ask your health care  provider.  Control any long-term (chronic) conditions you have, such as high cholesterol or diabetes.  Identify your sources of stress and find ways to manage stress. This may include meditation, deep breathing, or making time for fun activities.   Alcohol use  Do not drink alcohol if: ? Your health care provider tells you not to drink. ? You are pregnant, may be pregnant, or are planning to become pregnant.  If you drink alcohol: ? Limit how much you use to:  0-1 drink a day for women.  0-2 drinks a day for men. ? Be aware of how much alcohol is in your drink. In the [place] one drink equals one 12 oz bottle of beer (355 mL), one 5 oz glass of wine (148 mL), or one 1 oz glass of hard liquor (44 mL). Medicines Your health care provider may prescribe medicine if lifestyle changes are not enough to get your blood pressure under control and if:  Your systolic blood pressure is 130 or higher.  Your diastolic blood pressure is 80 or higher. Take medicines only as told by your health care provider. Follow the directions carefully. Blood pressure medicines must be taken as told by your health care provider. The medicine does not work as well when you skip doses. Skipping doses also puts you at risk for problems. Monitoring Before you monitor your blood pressure:  Do not smoke, drink caffeinated beverages, or exercise within 30 minutes before taking a measurement.  Use the bathroom and empty your bladder (urinate).  Sit quietly for at least 5 minutes before taking measurements. Monitor your blood pressure at home as told by your health care provider. To do this:  Sit with your back straight and supported.  Place your feet flat on the floor. Do not cross your legs.  Support your arm on a flat surface, such as a table. Make sure your upper arm is at heart level.  Each time you measure, take two or three readings one minute apart and record the results. You may also need to have your  blood pressure checked regularly by your health care provider.   General information  Talk with your health care provider about your diet, exercise habits, and other lifestyle factors that may be contributing to hypertension.  Review all the medicines you take with your health care provider because there may be side effects or interactions.  Keep all visits as told by your health care provider. Your health care provider can help you create and adjust your plan for managing your high blood pressure. Where to find more information  National Heart, Lung, and Blood Institute: www.nhlbi.nih.gov  American Heart Association: www.heart.org Contact a health care provider if:  You think you are having a reaction to medicines you have taken.  You have repeated (recurrent) headaches.  You feel dizzy.  You have swelling in your ankles.  You have trouble with your vision. Get help right away if:  You develop a severe headache or confusion.  You have unusual weakness or numbness, or you feel faint.  You have severe pain in your chest or abdomen.  You vomit repeatedly.  You have trouble breathing. These symptoms may represent a serious problem that is an emergency. Do not wait   to see if the symptoms will go away. Get medical help right away. Call your local emergency services (911 in the [place] Do not drive yourself to the hospital. Summary  Hypertension is when the force of blood pumping through your arteries is too strong. If this condition is not controlled, it may put you at risk for serious complications.  Your personal target blood pressure may vary depending on your medical conditions, your age, and other factors. For most people, a normal blood pressure is less than 120/80.  Hypertension is managed by lifestyle changes, medicines, or both.  Lifestyle changes to help manage hypertension include losing weight, eating a healthy, low-sodium diet, exercising more, stopping smoking, and  limiting alcohol. This information is not intended to replace advice given to you by your health care provider. Make sure you discuss any questions you have with your health care provider. Document Revised: 10/17/2019 Document Reviewed: 08/12/2019 Elsevier Patient Education  2021 Elsevier Inc.  

## 2020-12-22 NOTE — Progress Notes (Signed)
    Post Partum Visit Note  Paula Nunez is a 20 y.o. G28P1001 female who presents for a postpartum visit. She is 6 weeks postpartum following a normal spontaneous vaginal delivery.  I have fully reviewed the prenatal and intrapartum course. The delivery was at [redacted]w[redacted]d gestational weeks.  Anesthesia: epidural. Postpartum course has been uncomplicated. Baby is doing well. Baby is feeding by bottle - Gerber Gentle Pro. Bleeding no bleeding. Bowel function is normal. Bladder function is normal. Patient is sexually active. Contraception method is Depo-Provera injections. Postpartum depression screening: negative.   The pregnancy intention screening data noted above was reviewed. Potential methods of contraception were discussed. The patient elected to proceed with Hormonal Injection.    The following portions of the patient's history were reviewed and updated as appropriate: allergies, current medications, past family history, past medical history, past social history, past surgical history and problem list.  Review of Systems Pertinent items noted in HPI and remainder of comprehensive ROS otherwise negative.    Objective:  LMP 02/11/2020 (Approximate)   Constitutional: Well-developed, well-nourished pregnant female in no acute distress.  HEENT: PERRLA Skin: normal color and turgor, no rash Cardiovascular: normal rate & rhythm, no murmur Respiratory: normal effort, lung sounds clear throughout GI: Abd soft, non-tender, pos BS x 4, gravid appropriate for gestational age MS: Extremities nontender, no edema, normal ROM Neurologic: Alert and oriented x 4.  GU: no CVA tenderness Pelvic exam deferred       Assessment:  1. Postpartum care after vaginal delivery - prescription for Depo sent to pharmacy, pt due in 6wks for repeat dose  2. Elevated blood pressure reading - PEC labs ordered, currently asymptomatic  Plan:   Essential components of care per ACOG recommendations:  1.  Mood and  well being: Patient with negative depression screening today. Reviewed local resources for support.  - Patient does not use tobacco. - hx of drug use? No   2. Infant care and feeding:  -Patient currently breastmilk feeding? No, involution happened without concerns -Social determinants of health (SDOH) reviewed in EPIC. No concerns  3. Sexuality, contraception and birth spacing - Patient does not want a pregnancy in the next year.  Desired family size is unknown at this time. - Reviewed forms of contraception in tiered fashion. Patient desired Depo-Provera , prescription sent to pharmacy, will re-dose in 6wks. - Discussed birth spacing of 18 months  4. Sleep and fatigue -Encouraged family/partner/community support of 4 hrs of uninterrupted sleep to help with mood and fatigue  5. Physical Recovery  - Discussed patients delivery and complications - Patient had a 1st degree laceration, perineal healing reviewed. Patient expressed understanding - Patient has urinary incontinence? No - Patient is safe to resume physical and sexual activity  6.  Health Maintenance - Pap smear and mammogram not indicated  7. No chronic disease - PCP follow up as needed, will refer if BP continues to be elevated. PEC labs drawn today.  Edd Arbour, CNM, MSN, IBCLC Certified Nurse Midwife, [facility]

## 2020-12-23 LAB — CBC
Hematocrit: 34.4 % (ref 34.0–46.6)
Hemoglobin: 11.2 g/dL (ref 11.1–15.9)
MCH: 24.3 pg — ABNORMAL LOW (ref 26.6–33.0)
MCHC: 32.6 g/dL (ref 31.5–35.7)
MCV: 75 fL — ABNORMAL LOW (ref 79–97)
Platelets: 261 10*3/uL (ref 150–450)
RBC: 4.6 x10E6/uL (ref 3.77–5.28)
RDW: 14.7 % (ref 11.7–15.4)
WBC: 5.3 10*3/uL (ref 3.4–10.8)

## 2020-12-23 LAB — COMPREHENSIVE METABOLIC PANEL
ALT: 14 IU/L (ref 0–32)
AST: 17 IU/L (ref 0–40)
Albumin/Globulin Ratio: 1.4 (ref 1.2–2.2)
Albumin: 4 g/dL (ref 3.9–5.0)
Alkaline Phosphatase: 104 IU/L (ref 42–106)
BUN/Creatinine Ratio: 15 (ref 9–23)
BUN: 13 mg/dL (ref 6–20)
Bilirubin Total: <0.2 mg/dL (ref 0.0–1.2)
CO2: 20 mmol/L (ref 20–29)
Calcium: 9.4 mg/dL (ref 8.7–10.2)
Chloride: 104 mmol/L (ref 96–106)
Creatinine, Ser: 0.87 mg/dL (ref 0.57–1.00)
Globulin, Total: 2.9 g/dL (ref 1.5–4.5)
Glucose: 91 mg/dL (ref 65–99)
Potassium: 3.9 mmol/L (ref 3.5–5.2)
Sodium: 140 mmol/L (ref 134–144)
Total Protein: 6.9 g/dL (ref 6.0–8.5)
eGFR: 98 mL/min/{1.73_m2} (ref >59)

## 2020-12-28 ENCOUNTER — Other Ambulatory Visit (INDEPENDENT_AMBULATORY_CARE_PROVIDER_SITE_OTHER): Payer: Medicaid Other | Admitting: *Deleted

## 2020-12-28 ENCOUNTER — Other Ambulatory Visit: Payer: Self-pay

## 2020-12-28 DIAGNOSIS — O24410 Gestational diabetes mellitus in pregnancy, diet controlled: Secondary | ICD-10-CM | POA: Diagnosis not present

## 2020-12-28 NOTE — Progress Notes (Signed)
   Patient in clinic to complete postpartum 2 hr glucose.  Clovis Pu, RN

## 2020-12-29 LAB — GLUCOSE TOLERANCE, 2 HOURS
Glucose, 2 hour: 87 mg/dL (ref 65–139)
Glucose, GTT - Fasting: 90 mg/dL (ref 65–99)

## 2021-02-07 ENCOUNTER — Ambulatory Visit: Payer: Medicaid Other

## 2021-02-08 ENCOUNTER — Ambulatory Visit (INDEPENDENT_AMBULATORY_CARE_PROVIDER_SITE_OTHER): Payer: Medicaid Other | Admitting: *Deleted

## 2021-02-08 ENCOUNTER — Other Ambulatory Visit: Payer: Self-pay

## 2021-02-08 VITALS — BP 136/82 | HR 66 | Temp 97.9°F | Wt 176.0 lb

## 2021-02-08 DIAGNOSIS — Z3042 Encounter for surveillance of injectable contraceptive: Secondary | ICD-10-CM

## 2021-02-08 MED ORDER — MEDROXYPROGESTERONE ACETATE 150 MG/ML IM SUSP
150.0000 mg | Freq: Once | INTRAMUSCULAR | Status: AC
  Administered 2021-02-08: 150 mg via INTRAMUSCULAR

## 2021-02-08 NOTE — Progress Notes (Signed)
   Subjective:  Pt in for Depo Provera injection.    Objective: Need for contraception. No unusual complaints.    Assessment: Pt tolerated Depo injection. Depo given Left Deltoid.   Plan:  Next injection due August 2-16, 2022.    Clovis Pu, RN

## 2021-03-31 ENCOUNTER — Other Ambulatory Visit: Payer: Self-pay

## 2021-03-31 ENCOUNTER — Inpatient Hospital Stay (HOSPITAL_COMMUNITY)
Admission: AD | Admit: 2021-03-31 | Discharge: 2021-03-31 | Disposition: A | Discharge status: Home / Self Care | Payer: Medicaid Other | Attending: Obstetrics & Gynecology | Admitting: Obstetrics & Gynecology

## 2021-03-31 DIAGNOSIS — R197 Diarrhea, unspecified: Secondary | ICD-10-CM | POA: Insufficient documentation

## 2021-03-31 DIAGNOSIS — R1110 Vomiting, unspecified: Secondary | ICD-10-CM | POA: Insufficient documentation

## 2021-03-31 DIAGNOSIS — N939 Abnormal uterine and vaginal bleeding, unspecified: Secondary | ICD-10-CM | POA: Insufficient documentation

## 2021-03-31 LAB — POCT PREGNANCY, URINE: Preg Test, Ur: NEGATIVE

## 2021-03-31 NOTE — MAU Note (Signed)
Pt reports she thinks she has a stomach bug, vomiting and diarrhea since this am. Also reports she has been on her period x 1 1/2 weeks. Pt had a baby in February, had depo postpartum and again in May.

## 2021-03-31 NOTE — MAU Provider Note (Signed)
Event Date/Time   First Provider Initiated Contact with Patient 03/31/21 2030      S [name] is a 20 y.o. G1P1001 patient who presents to MAU today with complaint of vomiting, diarrhea, and vaginal bleeding.  Delivered in February and has been taking DepoProvera for contraception .  RN Note: Pt reports she thinks she has a stomach bug, vomiting and diarrhea since this am. Also reports she has been on her period x 1 1/2 weeks. Pt had a baby in February, had depo postpartum and again in May  O BP (!) 143/77 (BP Location: Right Arm)   Pulse 98   Temp (!) 100.4 F (38 C) (Oral)   Resp 17   LMP 03/23/2021   SpO2 100%  Physical Exam Vitals reviewed.  Constitutional:      General: She is not in acute distress.    Appearance: She is not ill-appearing or toxic-appearing.  HENT:     Head: Normocephalic.  Cardiovascular:     Rate and Rhythm: Normal rate.  Pulmonary:     Effort: Pulmonary effort is normal.  Skin:    General: Skin is warm.  Neurological:     General: No focal deficit present.     Mental Status: She is alert.    A Medical screening exam complete   P Discharge from MAU in stable condition Discussed she can try taking ibuprofen for bleeding, which may help Advised her to call office for followup Patient given the option of transfer to [facility] for further evaluation or seek care in outpatient facility of choice  List of options for follow-up given  Warning signs for worsening condition that would warrant emergency follow-up discussed Patient may return to MAU as needed   Valora Piccolo 03/31/2021 8:31 PM

## 2021-04-07 ENCOUNTER — Ambulatory Visit: Payer: Medicaid Other

## 2021-04-13 ENCOUNTER — Ambulatory Visit: Payer: Medicaid Other | Admitting: Obstetrics and Gynecology

## 2021-04-13 ENCOUNTER — Other Ambulatory Visit (HOSPITAL_COMMUNITY)
Admission: RE | Admit: 2021-04-13 | Discharge: 2021-04-13 | Disposition: A | Discharge status: Home / Self Care | Payer: Medicaid Other | Source: Ambulatory Visit | Attending: Obstetrics and Gynecology | Admitting: Obstetrics and Gynecology

## 2021-04-13 ENCOUNTER — Encounter: Payer: Self-pay | Admitting: Obstetrics and Gynecology

## 2021-04-13 ENCOUNTER — Ambulatory Visit (INDEPENDENT_AMBULATORY_CARE_PROVIDER_SITE_OTHER): Payer: Medicaid Other | Admitting: Obstetrics and Gynecology

## 2021-04-13 ENCOUNTER — Other Ambulatory Visit: Payer: Self-pay

## 2021-04-13 VITALS — BP 130/82 | HR 87 | Temp 97.6°F | Wt 169.0 lb

## 2021-04-13 DIAGNOSIS — Z113 Encounter for screening for infections with a predominantly sexual mode of transmission: Secondary | ICD-10-CM | POA: Diagnosis not present

## 2021-04-13 DIAGNOSIS — N898 Other specified noninflammatory disorders of vagina: Secondary | ICD-10-CM

## 2021-04-13 NOTE — Progress Notes (Signed)
  GYNECOLOGY PROGRESS NOTE  History:  [name] is a 20 y.o. G1P1001 presents to [facility] office today for problem gyn visit. She reports itchy "bumps" in vaginal area. She first noticed the "bumps" while she was in the shower 1 week ago. Last SI was 04/09/21; protection was used. She denies h/a, dizziness, shortness of breath, n/v, or fever/chills.    The following portions of the patient's history were reviewed and updated as appropriate: allergies, current medications, past family history, past medical history, past social history, past surgical history and problem list.   Review of Systems:  Pertinent items are noted in HPI.   Objective:  Physical Exam Blood pressure 130/82, pulse 87, temperature 97.6 F (36.4 C), temperature source Oral, weight 169 lb (76.7 kg), last menstrual period 04/10/2021, not currently breastfeeding. VS reviewed, nursing note reviewed,  Constitutional: well developed, well nourished, no distress HEENT: normocephalic CV: normal rate Pulm/chest wall: normal effort Breast Exam: deferred Abdomen: soft Neuro: alert and oriented x 3 Skin: warm, dry Psych: affect normal Pelvic exam: Reddened ?lesions at introitus from 5-7 o'clock position, external genitalia with (2) 1 cm round reddened ?lesions on RT labia minora, no drainage, not tender. Bimanual exam: deferred  Assessment & Plan:  1. Screen for STD (sexually transmitted disease) - Cervicovaginal ancillary only( [place]) - Herpes simplex virus culture  2. Vaginal lesion - Herpes simplex virus culture - Will await results for treatment since not certain the lesions are HSV  *Need for pap discovered after patient left office - will schedule for pap at a later date  Raelyn Mora, CNM 12:10 PM

## 2021-04-14 LAB — CERVICOVAGINAL ANCILLARY ONLY
Bacterial Vaginitis (gardnerella): POSITIVE — AB
Candida Glabrata: NEGATIVE
Candida Vaginitis: NEGATIVE
Chlamydia: NEGATIVE
Comment: NEGATIVE
Comment: NEGATIVE
Comment: NEGATIVE
Comment: NEGATIVE
Comment: NEGATIVE
Comment: NORMAL
Neisseria Gonorrhea: NEGATIVE
Trichomonas: NEGATIVE

## 2021-04-15 ENCOUNTER — Other Ambulatory Visit: Payer: Self-pay | Admitting: Obstetrics and Gynecology

## 2021-04-15 DIAGNOSIS — B9689 Other specified bacterial agents as the cause of diseases classified elsewhere: Secondary | ICD-10-CM

## 2021-04-15 LAB — HERPES SIMPLEX VIRUS CULTURE

## 2021-04-15 MED ORDER — METRONIDAZOLE 500 MG PO TABS: 500.0000 mg | ORAL_TABLET | Freq: Two times a day (BID) | ORAL | 0 refills | Status: DC

## 2021-05-02 ENCOUNTER — Ambulatory Visit: Payer: Medicaid Other

## 2021-05-03 ENCOUNTER — Ambulatory Visit (INDEPENDENT_AMBULATORY_CARE_PROVIDER_SITE_OTHER): Payer: Medicaid Other | Admitting: *Deleted

## 2021-05-03 ENCOUNTER — Other Ambulatory Visit: Payer: Self-pay

## 2021-05-03 ENCOUNTER — Encounter: Payer: Self-pay | Admitting: *Deleted

## 2021-05-03 VITALS — BP 127/73 | HR 115 | Temp 98.4°F | Ht 65.0 in | Wt 167.8 lb

## 2021-05-03 DIAGNOSIS — Z3042 Encounter for surveillance of injectable contraceptive: Secondary | ICD-10-CM

## 2021-05-03 MED ORDER — MEDROXYPROGESTERONE ACETATE 150 MG/ML IM SUSP
150.0000 mg | Freq: Once | INTRAMUSCULAR | Status: AC
  Administered 2021-05-03: 150 mg via INTRAMUSCULAR

## 2021-05-03 NOTE — Progress Notes (Signed)
   Subjective:  Pt in for Depo Provera injection.    Objective: Need for contraception. No unusual complaints.    Assessment: Pt tolerated Depo injection. Depo given Left Deltoid.   Plan:  Next injection due 07/19/2021-08/03/2021.    Clovis Pu, RN

## 2021-07-19 ENCOUNTER — Ambulatory Visit: Payer: Medicaid Other

## 2021-07-20 ENCOUNTER — Other Ambulatory Visit (HOSPITAL_COMMUNITY)
Admission: RE | Admit: 2021-07-20 | Discharge: 2021-07-20 | Disposition: A | Discharge status: Home / Self Care | Payer: Medicaid Other | Source: Ambulatory Visit | Attending: Certified Nurse Midwife | Admitting: Certified Nurse Midwife

## 2021-07-20 ENCOUNTER — Encounter: Payer: Self-pay | Admitting: Certified Nurse Midwife

## 2021-07-20 ENCOUNTER — Ambulatory Visit (INDEPENDENT_AMBULATORY_CARE_PROVIDER_SITE_OTHER): Payer: Medicaid Other | Admitting: Certified Nurse Midwife

## 2021-07-20 ENCOUNTER — Other Ambulatory Visit: Payer: Self-pay

## 2021-07-20 VITALS — BP 129/71 | HR 94 | Temp 98.1°F | Ht 65.0 in | Wt 163.2 lb

## 2021-07-20 DIAGNOSIS — Z01419 Encounter for gynecological examination (general) (routine) without abnormal findings: Secondary | ICD-10-CM

## 2021-07-20 DIAGNOSIS — O26899 Other specified pregnancy related conditions, unspecified trimester: Secondary | ICD-10-CM | POA: Insufficient documentation

## 2021-07-20 DIAGNOSIS — N644 Mastodynia: Secondary | ICD-10-CM | POA: Diagnosis not present

## 2021-07-20 DIAGNOSIS — L700 Acne vulgaris: Secondary | ICD-10-CM | POA: Diagnosis not present

## 2021-07-20 DIAGNOSIS — Z113 Encounter for screening for infections with a predominantly sexual mode of transmission: Secondary | ICD-10-CM | POA: Insufficient documentation

## 2021-07-20 DIAGNOSIS — N898 Other specified noninflammatory disorders of vagina: Secondary | ICD-10-CM

## 2021-07-20 DIAGNOSIS — Z124 Encounter for screening for malignant neoplasm of cervix: Secondary | ICD-10-CM

## 2021-07-20 LAB — POCT URINE PREGNANCY: Preg Test, Ur: NEGATIVE

## 2021-07-20 MED ORDER — CLINDAMYCIN PHOS-BENZOYL PEROX 1.2-5 % EX GEL: 1.0000 "application " | Freq: Every day | CUTANEOUS | 0 refills | Status: DC

## 2021-07-20 MED ORDER — DOXYCYCLINE & BENZOYL PEROXIDE 30 X 100 MG & 4.4% CO THPK: 1.0000 "application " | PACK | Freq: Two times a day (BID) | 2 refills | Status: DC

## 2021-07-20 NOTE — Progress Notes (Signed)
GYNECOLOGY CLINIC ANNUAL PREVENTATIVE CARE ENCOUNTER NOTE  Subjective:   Paula Nunez is a 20 y.o. G44P1001 female here for a routine annual gynecologic exam at 78mo postpartum.  Current complaints: breast tenderness, acne, and hair loss. Having some postpartum depression but feels she is coping better now than she was in the first few months after the baby was born. Does not feel the need to take medications.   Denies abnormal vaginal bleeding, discharge, pelvic pain, problems with intercourse or other gynecologic concerns.    Gynecologic History No LMP recorded. Patient has had an injection. Contraception: Depo-Provera injections Last Pap: Never (age), will complete today. Last mammogram: Never (age).   Obstetric History OB History  Gravida Para Term Preterm AB Living  1 1 1     1   SAB IAB Ectopic Multiple Live Births        0 1    # Outcome Date GA Lbr Len/2nd Weight Sex Delivery Anes PTL Lv  1 Term 11/09/20 [redacted]w[redacted]d 06:18 / 00:10 7 lb 1.2 oz (3.209 kg) M Vag-Spont EPI  LIV   Past Medical History:  Diagnosis Date   Gestational diabetes mellitus (GDM), antepartum 09/08/2020   Current Diabetic Medications:  None  [ ]  Aspirin 81 mg daily after 12 weeks (? A2/B GDM)  Required Referrals for A1GDM or A2GDM: [ ]  Diabetes Education and Testing Supplies [ ]  Nutrition Cousult  For A2/B GDM or higher classes of DM [ ]  Diabetes Education and Testing Supplies [ ]  Nutrition Counsult [ ]  Fetal ECHO after 20 weeks  [ ]  Eye exam for retina evaluation   Baseline and surveillance labs (   Gestational hypertension 11/09/2020   Medical history non-contributory    Past Surgical History:  Procedure Laterality Date   NO PAST SURGERIES     Current Outpatient Medications on File Prior to Visit  Medication Sig Dispense Refill   medroxyPROGESTERone (DEPO-PROVERA) 150 MG/ML injection Inject 1 mL (150 mg total) into the muscle every 3 (three) months. 1 mL 3   metroNIDAZOLE (FLAGYL) 500 MG tablet Take 1  tablet (500 mg total) by mouth 2 (two) times daily. 14 tablet 0   No current facility-administered medications on file prior to visit.   No Known Allergies  Social History   Socioeconomic History   Marital status: Single    Spouse name: Not on file   Number of children: Not on file   Years of education: Not on file   Highest education level: High school graduate  Occupational History   Occupation: [employer]  Tobacco Use   Smoking status: Never   Smokeless tobacco: Never  Vaping Use   Vaping Use: Never used  Substance and Sexual Activity   Alcohol use: Not Currently   Drug use: Not Currently    Types: Marijuana    Comment: last time used 08/2020   Sexual activity: Yes    Birth control/protection: None  Other Topics Concern   Not on file  Social History Narrative   Not on file   Social Determinants of Health   Financial Resource Strain: Not on file  Food Insecurity: Not on file  Transportation Needs: Not on file  Physical Activity: Not on file  Stress: Not on file  Social Connections: Not on file  Intimate Partner Violence: Not on file   Family History  Problem Relation Age of Onset   Healthy Mother    Healthy Father    The following portions of the patient's history were  reviewed and updated as appropriate: allergies, current medications, past family history, past medical history, past social history, past surgical history and problem list.  Review of Systems Pertinent items noted in HPI and remainder of comprehensive ROS otherwise negative.   Objective:  BP 129/71 (BP Location: Left Arm, Patient Position: Sitting, Cuff Size: Normal)   Pulse 94   Temp 98.1 F (36.7 C) (Oral)   Ht 5\' 5"  (1.651 m)   Wt 163 lb 3.2 oz (74 kg)   BMI 27.16 kg/m  CONSTITUTIONAL: Well-developed, well-nourished female in no acute distress.  HENT:  Normocephalic, atraumatic, External right and left ear normal.  EYES: Conjunctivae and EOM are normal. Pupils are equal, round, and  reactive to light. No scleral icterus.  NECK: Normal range of motion, supple, no masses.  SKIN: Skin is warm and dry. No rash noted. Not diaphoretic. No erythema. No pallor. NEUROLGIC: Alert and oriented to person, place, and time. Normal reflexes, muscle tone coordination. No cranial nerve deficit noted. PSYCHIATRIC: Normal mood and affect. Normal behavior. Normal judgment and thought content. CARDIOVASCULAR: Normal heart rate noted, regular rhythm RESPIRATORY: Effort normal, no problems with respiration noted. BREASTS: Symmetric in size. No masses, skin changes, nipple drainage, or lymphadenopathy. ABDOMEN: Soft, no distention noted.  No tenderness, rebound or guarding.  PELVIC: Normal appearing external genitalia; normal appearing vaginal mucosa and cervix.  No abnormal discharge noted.  Pap smear obtained.  Normal uterine size, no other palpable masses, no uterine or adnexal tenderness. MUSCULOSKELETAL: Normal range of motion. No tenderness.  No cyanosis, clubbing, or edema.  2+ distal pulses.  Assessment & Plan:  Annual gynecologic examination with pap smear - will follow and manage accordingly - routine preventative health maintenance measures emphasized - using Depo for birth control, will pick up prescription and bring back for dosing by 08/03/21  2. STD screening for vaginal discharge - cervicovaginal ancillary   3. Breast tenderness - Advised this is likely due to hormone shift during first year of postpartum recovery, encouraged to wear a supportive bra  4. Acne vulgaris - Advised also likely due to hormone shift, clindamycin benzoyl cream prescribed.  Follow up in one year for annual exam or PRN  13/9/22, CNM, MSN, IBCLC Certified Nurse Midwife, [facility]

## 2021-07-21 LAB — CERVICOVAGINAL ANCILLARY ONLY
Bacterial Vaginitis (gardnerella): POSITIVE — AB
Candida Glabrata: NEGATIVE
Candida Vaginitis: NEGATIVE
Chlamydia: NEGATIVE
Comment: NEGATIVE
Comment: NEGATIVE
Comment: NEGATIVE
Comment: NEGATIVE
Comment: NEGATIVE
Comment: NORMAL
Neisseria Gonorrhea: NEGATIVE
Trichomonas: NEGATIVE

## 2021-07-21 LAB — CYTOLOGY - PAP
Adequacy: ABSENT
Diagnosis: NEGATIVE

## 2021-07-22 MED ORDER — METRONIDAZOLE 0.75 % VA GEL: 1.0000 | Freq: Every day | VAGINAL | 1 refills | Status: DC

## 2021-07-22 NOTE — Addendum Note (Signed)
Addended by: Edd Arbour on: 07/22/2021 03:43 AM   Modules accepted: Orders

## 2021-09-25 NOTE — L&D Delivery Note (Signed)
OB/GYN Faculty Practice Delivery Note  Paula Nunez is a 21 y.o. G3P1011 s/p VD at [redacted]w[redacted]d. She was admitted for active labor.   ROM: 0h 48m with clear fluid GBS Status: Negative Maximum Maternal Temperature: 97.9  Labor Progress: Patient progressed to complete without intervention.  She desires epidural, but due to rapid progress only received dose of IV Fentanyl.  Cat I FT throughout labor process.   Delivery Date/Time: Thursday August 31, 2022 at 1713 Delivery: Called to room and patient was complete and pushing. Amniotic Sac bulging and artificially ruptured by provider.  Patient continued to push and head delivered in LOA position with restitution to LOT. After delivery of shoulders, patient encouraged to reach down and delivery infant body which occurred without incident. Infant with spontaneous cry and was dried and stimulated by provider and nurse. Cord clamped x 2 after 2-minute delay, and cut by FOB-Curtiz. Cord blood drawn. Pitocin initiated and placenta delivered spontaneously, via [place], with gentle cord traction. Fundus firm with massage and Pitocin. Labia, perineum, vagina, and cervix inspected revealing a left hemostatic labial abrasion.  Reviewed immediate postpartum care including fundal checks, pain medication, and transfer to [facility].  Mother hemodynamically stable and infant skin to skin prior to provider exit.   Placenta: Intact, To Disposal Complications: None Lacerations: Left Labial Abrasion-Hemostatic EBL: 53 Analgesia: None  Postpartum Planning -BCM: Depo prior to D/C -Feeding: Bottle -Circumcision Desired; Consent Completed -PPV Message Sent  Infant: Female-[name]  APGARs 9, 9  3204g, 7lbs 10z, 19.75 inches  Cherre Robins, CNM  08/31/2022 5:47 PM

## 2021-09-28 ENCOUNTER — Encounter: Payer: Self-pay | Admitting: Certified Nurse Midwife

## 2021-10-02 ENCOUNTER — Encounter: Payer: Self-pay | Admitting: Certified Nurse Midwife

## 2021-10-03 ENCOUNTER — Ambulatory Visit: Payer: Medicaid Other

## 2021-10-31 ENCOUNTER — Other Ambulatory Visit: Payer: Self-pay

## 2021-10-31 ENCOUNTER — Ambulatory Visit (INDEPENDENT_AMBULATORY_CARE_PROVIDER_SITE_OTHER): Payer: Medicaid Other

## 2021-10-31 VITALS — BP 124/73 | HR 77 | Wt 145.6 lb

## 2021-10-31 DIAGNOSIS — Z3202 Encounter for pregnancy test, result negative: Secondary | ICD-10-CM | POA: Diagnosis not present

## 2021-10-31 LAB — POCT URINE PREGNANCY: Preg Test, Ur: NEGATIVE

## 2021-10-31 NOTE — Progress Notes (Signed)
Pt presents for UPT. UPT negative. Pt states her LMP is 09/25/21. Advised pt to take another test in 2 weeks if her period doesn't come. Understanding was voiced.  Paula Nunez l Prerna Harold, CMA

## 2021-11-26 ENCOUNTER — Inpatient Hospital Stay (HOSPITAL_COMMUNITY): Payer: Medicaid Other

## 2021-11-26 ENCOUNTER — Encounter (HOSPITAL_COMMUNITY): Payer: Self-pay | Admitting: Obstetrics and Gynecology

## 2021-11-26 ENCOUNTER — Other Ambulatory Visit: Payer: Self-pay

## 2021-11-26 ENCOUNTER — Inpatient Hospital Stay (EMERGENCY_DEPARTMENT_HOSPITAL)
Admission: AD | Admit: 2021-11-26 | Discharge: 2021-11-27 | Disposition: A | Discharge status: Home / Self Care | Payer: Medicaid Other | Source: Home / Self Care | Attending: Obstetrics and Gynecology | Admitting: Obstetrics and Gynecology

## 2021-11-26 ENCOUNTER — Inpatient Hospital Stay (HOSPITAL_COMMUNITY)
Admission: AD | Admit: 2021-11-26 | Discharge: 2021-11-26 | Disposition: A | Discharge status: Home / Self Care | Payer: Medicaid Other | Attending: Obstetrics and Gynecology | Admitting: Obstetrics and Gynecology

## 2021-11-26 DIAGNOSIS — O09291 Supervision of pregnancy with other poor reproductive or obstetric history, first trimester: Secondary | ICD-10-CM | POA: Insufficient documentation

## 2021-11-26 DIAGNOSIS — O039 Complete or unspecified spontaneous abortion without complication: Secondary | ICD-10-CM | POA: Diagnosis not present

## 2021-11-26 DIAGNOSIS — O034 Incomplete spontaneous abortion without complication: Secondary | ICD-10-CM

## 2021-11-26 DIAGNOSIS — R109 Unspecified abdominal pain: Secondary | ICD-10-CM | POA: Insufficient documentation

## 2021-11-26 DIAGNOSIS — N83202 Unspecified ovarian cyst, left side: Secondary | ICD-10-CM | POA: Diagnosis not present

## 2021-11-26 DIAGNOSIS — Z3A01 Less than 8 weeks gestation of pregnancy: Secondary | ICD-10-CM

## 2021-11-26 DIAGNOSIS — Z3491 Encounter for supervision of normal pregnancy, unspecified, first trimester: Secondary | ICD-10-CM | POA: Diagnosis not present

## 2021-11-26 DIAGNOSIS — O26891 Other specified pregnancy related conditions, first trimester: Secondary | ICD-10-CM | POA: Insufficient documentation

## 2021-11-26 DIAGNOSIS — O209 Hemorrhage in early pregnancy, unspecified: Secondary | ICD-10-CM

## 2021-11-26 DIAGNOSIS — N8500 Endometrial hyperplasia, unspecified: Secondary | ICD-10-CM | POA: Diagnosis not present

## 2021-11-26 DIAGNOSIS — Z3A00 Weeks of gestation of pregnancy not specified: Secondary | ICD-10-CM | POA: Diagnosis not present

## 2021-11-26 LAB — CBC
HCT: 35.4 % — ABNORMAL LOW (ref 36.0–46.0)
Hemoglobin: 11.5 g/dL — ABNORMAL LOW (ref 12.0–15.0)
MCH: 25.6 pg — ABNORMAL LOW (ref 26.0–34.0)
MCHC: 32.5 g/dL (ref 30.0–36.0)
MCV: 78.8 fL — ABNORMAL LOW (ref 80.0–100.0)
Platelets: 303 10*3/uL (ref 150–400)
RBC: 4.49 MIL/uL (ref 3.87–5.11)
RDW: 13.6 % (ref 11.5–15.5)
WBC: 5 10*3/uL (ref 4.0–10.5)
nRBC: 0 % (ref 0.0–0.2)

## 2021-11-26 LAB — POCT PREGNANCY, URINE: Preg Test, Ur: POSITIVE — AB

## 2021-11-26 LAB — URINALYSIS, ROUTINE W REFLEX MICROSCOPIC
Bilirubin Urine: NEGATIVE
Glucose, UA: NEGATIVE mg/dL
Ketones, ur: 5 mg/dL — AB
Leukocytes,Ua: NEGATIVE
Nitrite: NEGATIVE
Protein, ur: NEGATIVE mg/dL
Specific Gravity, Urine: 1.03 (ref 1.005–1.030)
pH: 5 (ref 5.0–8.0)

## 2021-11-26 LAB — WET PREP, GENITAL
Sperm: NONE SEEN
Trich, Wet Prep: NONE SEEN
WBC, Wet Prep HPF POC: <10 (ref <10)
Yeast Wet Prep HPF POC: NONE SEEN

## 2021-11-26 LAB — HCG, QUANTITATIVE, PREGNANCY: hCG, Beta Chain, Quant, S: 21533 m[IU]/mL — ABNORMAL HIGH (ref <5)

## 2021-11-26 MED ORDER — MISOPROSTOL 200 MCG PO TABS
800.0000 ug | ORAL_TABLET | Freq: Once | ORAL | Status: AC
  Administered 2021-11-26: 800 ug via BUCCAL
  Filled 2021-11-26: qty 4

## 2021-11-26 MED ORDER — OXYCODONE-ACETAMINOPHEN 5-325 MG PO TABS
1.0000 | ORAL_TABLET | Freq: Four times a day (QID) | ORAL | 0 refills | Status: AC | PRN
Start: 2021-11-26 — End: 2021-11-29

## 2021-11-26 NOTE — Discharge Instructions (Signed)
Return to care  If you have heavier bleeding that soaks through more than 2 pads per hour for an hour or more If you bleed so much that you feel like you might pass out or you do pass out If you have significant abdominal pain that is not improved with Tylenol   

## 2021-11-26 NOTE — MAU Note (Signed)
..  Paula Nunez is a 21 y.o. at [redacted]w[redacted]d here in MAU reporting: ABD pain, pt reports feeling like cramping. Pt reports VB like a heavy flow period with medium and large clots. Pt wearing a pad, and changing pads often. Pt states she was here in MAU earlier today for the same compliant.  ?Onset of complaint: 1900 ?Pain score: 9/10 ?Vitals:  ? 11/26/21 2150  ?BP: 135/62  ?Pulse: 81  ?Resp: 16  ?Temp: 98.7 ?F (37.1 ?C)  ?SpO2: 100%  ?   ? ?Lab orders placed from triage:  none ? ?

## 2021-11-26 NOTE — MAU Note (Signed)
Pt reports to mau with c/o seeing blood in urine X2.  States she saw blood in the middle of the night in the toilet and then again in MAU when giving a sample.  Denies urinary symptoms.  Also reports lower abd cramping that started this morning.   ?

## 2021-11-26 NOTE — MAU Provider Note (Signed)
History     CSN: [id number]  Arrival date and time: 11/26/21 1302   Event Date/Time   First Provider Initiated Contact with Patient 11/26/21 1344      Chief Complaint  Patient presents with   Abdominal Pain   Vaginal Bleeding   HPI Paula Nunez is a 21 y.o. G2P1001 at [redacted]w[redacted]d who presents with vaginal bleeding. She states she first saw some spotting last night and in the toilet after she urinated. She then saw some spotting when she wiped again at 1000. She reports some lower abdominal discomfort but denies any pain. She has not been seen anywhere this pregnancy yet.   OB History     Gravida  2   Para  1   Term  1   Preterm      AB      Living  1      SAB      IAB      Ectopic      Multiple  0   Live Births  1           Past Medical History:  Diagnosis Date   Gestational diabetes 11/08/2020   Gestational diabetes mellitus (GDM), antepartum 09/08/2020   Current Diabetic Medications:  None  [ ]  Aspirin 81 mg daily after 12 weeks (? A2/B GDM)  Required Referrals for A1GDM or A2GDM: [ ]  Diabetes Education and Testing Supplies [ ]  Nutrition Cousult  For A2/B GDM or higher classes of DM [ ]  Diabetes Education and Testing Supplies [ ]  Nutrition Counsult [ ]  Fetal ECHO after 20 weeks  [ ]  Eye exam for retina evaluation   Baseline and surveillance labs (   Gestational hypertension 11/09/2020   Medical history non-contributory     Past Surgical History:  Procedure Laterality Date   NO PAST SURGERIES      Family History  Problem Relation Age of Onset   Healthy Mother    Healthy Father     Social History   Tobacco Use   Smoking status: Never   Smokeless tobacco: Never  Vaping Use   Vaping Use: Never used  Substance Use Topics   Alcohol use: Not Currently   Drug use: Yes    Types: Marijuana    Comment: last time used 08/2020    Allergies: No Known Allergies  Medications Prior to Admission  Medication Sig Dispense Refill Last Dose    Clindamycin-Benzoyl Per, Refr, gel Apply 1 application topically at bedtime. (Patient not taking: Reported on 10/31/2021) 45 g 0    medroxyPROGESTERone (DEPO-PROVERA) 150 MG/ML injection Inject 1 mL (150 mg total) into the muscle every 3 (three) months. (Patient not taking: Reported on 10/31/2021) 1 mL 3    metroNIDAZOLE (FLAGYL) 500 MG tablet Take 1 tablet (500 mg total) by mouth 2 (two) times daily. (Patient not taking: Reported on 10/31/2021) 14 tablet 0    metroNIDAZOLE (METROGEL) 0.75 % vaginal gel Place 1 Applicatorful vaginally at bedtime. Apply one applicatorful to vagina at bedtime for 5 days (Patient not taking: Reported on 10/31/2021) 70 g 1     Review of Systems  Constitutional: Negative.  Negative for fatigue and fever.  HENT: Negative.    Respiratory: Negative.  Negative for shortness of breath.   Cardiovascular: Negative.  Negative for chest pain.  Gastrointestinal: Negative.  Negative for abdominal pain, constipation, diarrhea, nausea and vomiting.  Genitourinary:  Positive for vaginal bleeding. Negative for dysuria and vaginal discharge.  Neurological: Negative.  Negative for dizziness  and headaches.  Physical Exam   Blood pressure 132/60, pulse 88, temperature 98.5 F (36.9 C), temperature source Oral, resp. rate 15, last menstrual period 09/28/2021, SpO2 98 %, not currently breastfeeding.  Physical Exam Vitals and nursing note reviewed.  Constitutional:      General: She is not in acute distress.    Appearance: She is well-developed.  HENT:     Head: Normocephalic.  Eyes:     Pupils: Pupils are equal, round, and reactive to light.  Cardiovascular:     Rate and Rhythm: Normal rate and regular rhythm.     Heart sounds: Normal heart sounds.  Pulmonary:     Effort: Pulmonary effort is normal. No respiratory distress.     Breath sounds: Normal breath sounds.  Abdominal:     General: Bowel sounds are normal. There is no distension.     Palpations: Abdomen is soft.      Tenderness: There is no abdominal tenderness.  Skin:    General: Skin is warm and dry.  Neurological:     Mental Status: She is alert and oriented to person, place, and time.  Psychiatric:        Mood and Affect: Mood normal.        Behavior: Behavior normal.        Thought Content: Thought content normal.        Judgment: Judgment normal.    MAU Course  Procedures Results for orders placed or performed during the hospital encounter of 11/26/21 (from the past 24 hour(s))  Pregnancy, urine POC     Status: Abnormal   Collection Time: 11/26/21  1:14 PM  Result Value Ref Range   Preg Test, Ur POSITIVE (A) NEGATIVE  Urinalysis, Routine w reflex microscopic Urine, Clean Catch     Status: Abnormal   Collection Time: 11/26/21  1:17 PM  Result Value Ref Range   Color, Urine YELLOW YELLOW   APPearance HAZY (A) CLEAR   Specific Gravity, Urine 1.030 1.005 - 1.030   pH 5.0 5.0 - 8.0   Glucose, UA NEGATIVE NEGATIVE mg/dL   Hgb urine dipstick MODERATE (A) NEGATIVE   Bilirubin Urine NEGATIVE NEGATIVE   Ketones, ur 5 (A) NEGATIVE mg/dL   Protein, ur NEGATIVE NEGATIVE mg/dL   Nitrite NEGATIVE NEGATIVE   Leukocytes,Ua NEGATIVE NEGATIVE   RBC / HPF 0-5 0 - 5 RBC/hpf   WBC, UA 0-5 0 - 5 WBC/hpf   Bacteria, UA RARE (A) NONE SEEN   Squamous Epithelial / LPF 11-20 0 - 5   Mucus PRESENT   Wet prep, genital     Status: Abnormal   Collection Time: 11/26/21  2:29 PM  Result Value Ref Range   Yeast Wet Prep HPF POC NONE SEEN NONE SEEN   Trich, Wet Prep NONE SEEN NONE SEEN   Clue Cells Wet Prep HPF POC PRESENT (A) NONE SEEN   WBC, Wet Prep HPF POC <10 <10   Sperm NONE SEEN   CBC     Status: Abnormal   Collection Time: 11/26/21  3:21 PM  Result Value Ref Range   WBC 5.0 4.0 - 10.5 K/uL   RBC 4.49 3.87 - 5.11 MIL/uL   Hemoglobin 11.5 (L) 12.0 - 15.0 g/dL   HCT 46.9 (L) 62.9 - 52.8 %   MCV 78.8 (L) 80.0 - 100.0 fL   MCH 25.6 (L) 26.0 - 34.0 pg   MCHC 32.5 30.0 - 36.0 g/dL   RDW 41.3 24.4 -  01.0 %  Platelets 303 150 - 400 K/uL   nRBC 0.0 0.0 - 0.2 %    US OB LESS THAN 14 WEEKS WITH OB TRANSVAGINAL  Result Date: 11/26/2021 CLINICAL DATA:  Vaginal bleeding during pregnancy. EXAM: OBSTETRIC <14 WK [place] AND TRANSVAGINAL OB US TECHNIQUE: Both transabdominal and transvaginal ultrasound examinations were performed for complete evaluation of the gestation as well as the maternal uterus, adnexal regions, and pelvic cul-de-sac. Transvaginal technique was performed to assess early pregnancy. COMPARISON:  None. FINDINGS: Intrauterine gestational sac: Single Yolk sac:  Not Visualized. Embryo:  Visualized. Cardiac Activity: Not Visualized. Heart Rate: 0 bpm MSD: 12.8 mm   6 w   0 d CRL:  1.5 mm.  Too small to calculate gestational age. Subchorionic hemorrhage:  None visualized. Maternal uterus/adnexae: Bilateral ovaries appear within normal limits. No free fluid. Additional findings: There is a 1.3 x 1.3 x 1.1 cm heterogeneous nonvascular area in the lower uterine segment endometrium which appears complex and cystic. IMPRESSION: 1. Single intrauterine gestation. Discordant crown-rump length and mean gestational sac diameter. No cardiac activity identified. Findings are suspicious but not yet definitive for failed pregnancy given small crown-rump length. Recommend follow-up US in 10-14 days for definitive diagnosis. This recommendation follows SRU consensus guidelines: Diagnostic Criteria for Nonviable Pregnancy Early in the First Trimester. Malva Limes Med 2013; 409:8119-14. 2. 1.3 x 1.3 x 1.1 cm heterogeneous nonvascular area in the lower uterine segment endometrium, indeterminate. Findings may represent clot. Other etiologies such as endometrial mass can not be entirely excluded. Electronically Signed   By: Darliss Cheney M.D.   On: 11/26/2021 15:19     MDM UA, UPT CBC, HCG ABO/Rh- O Pos Wet prep and gc/chlamydia US OB Comp Less 14 weeks with Transvaginal  Assessment and Plan   1. Normal intrauterine  pregnancy on prenatal ultrasound in first trimester   2. Vaginal bleeding affecting early pregnancy   3. [redacted] weeks gestation of pregnancy    -Discharge home in stable condition -Vaginal bleeding precautions discussed -Patient advised to follow-up with OB as scheduled for prenatal care. Order placed for viability ultrasound in 10-14 days -Patient may return to MAU as needed or if her condition were to change or worsen   Rolm Bookbinder CNM 11/26/2021, 1:44 PM

## 2021-11-26 NOTE — Discharge Instructions (Signed)

## 2021-11-26 NOTE — MAU Provider Note (Signed)
?History  ?  ? ?[id number] ? ?Arrival date and time: 11/26/21 2106 ?  ? ?Chief Complaint  ?Patient presents with  ? Abdominal Pain  ? ? ? ?HPI ?Paula Nunez is a 21 y.o. at [redacted]w[redacted]d by ultrasound, who presents for vaginal bleeding & abdominal cramping. Was seen in MAU earlier today with same complaint. States when she got home her symptoms worse. Reports passing several clots while in the shower.  ? ?OB History   ? ? Gravida  ?2  ? Para  ?1  ? Term  ?1  ? Preterm  ?   ? AB  ?   ? Living  ?1  ?  ? ? SAB  ?   ? IAB  ?   ? Ectopic  ?   ? Multiple  ?0  ? Live Births  ?1  ?   ?  ?  ? ? ?Past Medical History:  ?Diagnosis Date  ? Gestational diabetes 11/08/2020  ? Gestational hypertension 11/09/2020  ? ? ?Past Surgical History:  ?Procedure Laterality Date  ? NO PAST SURGERIES    ? ? ?Family History  ?Problem Relation Age of Onset  ? Healthy Mother   ? Healthy Father   ? ? ?No Known Allergies ? ?No current facility-administered medications on file prior to encounter.  ? ?No current outpatient medications on file prior to encounter.  ? ? ? ?ROS ?Pertinent positives and negative per HPI, all others reviewed and negative ? ?Physical Exam  ? ?BP 135/62 (BP Location: Right Arm)   Pulse 81   Temp 98.7 ?F (37.1 ?C) (Oral)   Resp 16   Ht 5\' 4"  (1.626 m)   Wt 68.3 kg   LMP 09/28/2021 (Exact Date)   SpO2 100%   BMI 25.85 kg/m?  ? ?Patient Vitals for the past 24 hrs: ? BP Temp Temp src Pulse Resp SpO2 Height Weight  ?11/26/21 2150 135/62 98.7 ?F (37.1 ?C) Oral 81 16 100 % 5\' 4"  (1.626 m) 68.3 kg  ? ? ?Physical Exam ?Vitals and nursing note reviewed. Exam conducted with a chaperone present.  ?Constitutional:   ?   General: She is not in acute distress. ?   Appearance: She is well-developed.  ?HENT:  ?   Head: Normocephalic and atraumatic.  ?Pulmonary:  ?   Effort: Pulmonary effort is normal. No respiratory distress.  ?Genitourinary: ?   Comments: Pelvic: NEFG, small amount of dark red blood. Cervix closed.  ?Neurological:  ?    Mental Status: She is alert.  ?  ? ? ?Labs ?Results for orders placed or performed during the hospital encounter of 11/26/21 (from the past 24 hour(s))  ?Pregnancy, urine POC     Status: Abnormal  ? Collection Time: 11/26/21  1:14 PM  ?Result Value Ref Range  ? Preg Test, Ur POSITIVE (A) NEGATIVE  ?Urinalysis, Routine w reflex microscopic Urine, Clean Catch     Status: Abnormal  ? Collection Time: 11/26/21  1:17 PM  ?Result Value Ref Range  ? Color, Urine YELLOW YELLOW  ? APPearance HAZY (A) CLEAR  ? Specific Gravity, Urine 1.030 1.005 - 1.030  ? pH 5.0 5.0 - 8.0  ? Glucose, UA NEGATIVE NEGATIVE mg/dL  ? Hgb urine dipstick MODERATE (A) NEGATIVE  ? Bilirubin Urine NEGATIVE NEGATIVE  ? Ketones, ur 5 (A) NEGATIVE mg/dL  ? Protein, ur NEGATIVE NEGATIVE mg/dL  ? Nitrite NEGATIVE NEGATIVE  ? Leukocytes,Ua NEGATIVE NEGATIVE  ? RBC / HPF 0-5 0 - 5 RBC/hpf  ?  WBC, UA 0-5 0 - 5 WBC/hpf  ? Bacteria, UA RARE (A) NONE SEEN  ? Squamous Epithelial / LPF 11-20 0 - 5  ? Mucus PRESENT   ?Wet prep, genital     Status: Abnormal  ? Collection Time: 11/26/21  2:29 PM  ?Result Value Ref Range  ? Yeast Wet Prep HPF POC NONE SEEN NONE SEEN  ? Trich, Wet Prep NONE SEEN NONE SEEN  ? Clue Cells Wet Prep HPF POC PRESENT (A) NONE SEEN  ? WBC, Wet Prep HPF POC <10 <10  ? Sperm NONE SEEN   ?CBC     Status: Abnormal  ? Collection Time: 11/26/21  3:21 PM  ?Result Value Ref Range  ? WBC 5.0 4.0 - 10.5 K/uL  ? RBC 4.49 3.87 - 5.11 MIL/uL  ? Hemoglobin 11.5 (L) 12.0 - 15.0 g/dL  ? HCT 35.4 (L) 36.0 - 46.0 %  ? MCV 78.8 (L) 80.0 - 100.0 fL  ? MCH 25.6 (L) 26.0 - 34.0 pg  ? MCHC 32.5 30.0 - 36.0 g/dL  ? RDW 13.6 11.5 - 15.5 %  ? Platelets 303 150 - 400 K/uL  ? nRBC 0.0 0.0 - 0.2 %  ?hCG, quantitative, pregnancy     Status: Abnormal  ? Collection Time: 11/26/21  3:21 PM  ?Result Value Ref Range  ? hCG, Beta Chain, Quant, S 21,533 (H) <5 mIU/mL  ? ? ?Imaging ? ?US OB Transvaginal ? ?Result Date: 11/26/2021 ?CLINICAL DATA:  Bleeding, passing clots. EXAM:  TRANSVAGINAL OB ULTRASOUND TECHNIQUE: Transvaginal ultrasound was performed for complete evaluation of the gestation as well as the maternal uterus, adnexal regions, and pelvic cul-de-sac. COMPARISON:  11/26/2021. FINDINGS: Intrauterine gestational sac: None. Yolk sac:  No Embryo:  No Cardiac Activity: No Heart Rate: None. Maternal uterus/adnexae: No evidence of intrauterine gestational sac. The endometrium is thickened measuring 2.4 cm, heterogeneous and internal vascularity is noted. The right ovary is within normal limits. A corpus luteal cyst is noted in the left ovary. No free fluid in the pelvis. IMPRESSION: No evidence of intrauterine pregnancy. The endometrium is thickened and heterogeneous with internal vascularity suggesting retained products of conception. Electronically Signed   By: Thornell Sartorius M.D.   On: 11/26/2021 23:05  ? ? ?MAU Course  ?Procedures ?Lab Orders  ?No laboratory test(s) ordered today  ? ?Meds ordered this encounter  ?Medications  ? misoprostol (CYTOTEC) tablet 800 mcg  ? oxyCODONE-acetaminophen (PERCOCET) 5-325 MG tablet  ?  Sig: Take 1 tablet by mouth every 6 (six) hours as needed for up to 3 days for severe pain.  ?  Dispense:  12 tablet  ?  Refill:  0  ?  Order Specific Question:   Supervising Provider  ?  Answer:   ERVIN, MICHAEL L [[id number]]  ? ?Imaging Orders    ?     US OB Transvaginal    ? ?MDM ?Patient presents for increased bleeding & pain. Ultrasound ordered. IUP previously seen is no longer seen. Endometrial thickness is 2.4 cm and has some internal vascularity concerning for retained products of conception. Patient is stable. Reviewed with Dr. Alysia Penna - will give cytotec & f/u in the office.  ? ?Patient given cytotec prior to discharge. Will send home with small rx of pain medication ? ?RH positive ? ?Assessment and Plan  ? ?1. Miscarriage  ?-Rx percocet  ?-take ibuprofen prn & reserve percocet for breakthrough pain ?-reviewed bleeding & infection precautions & reasons to return  to MAU ?-educated on pelvic  rest ?-message sent to office to schedule miscarriage f/u appointment  ?2. Vaginal bleeding in pregnancy, first trimester   ?3. Retained products of conception after miscarriage   ?4. [redacted] weeks gestation of pregnancy   ? ? ? ?Judeth Horn, NP ?11/26/21 ?11:45 PM ? ? ?

## 2021-11-28 LAB — GC/CHLAMYDIA PROBE AMP (~~LOC~~) NOT AT ARMC
Chlamydia: NEGATIVE
Comment: NEGATIVE
Comment: NORMAL
Neisseria Gonorrhea: NEGATIVE

## 2021-11-29 ENCOUNTER — Telehealth: Payer: Medicaid Other

## 2021-12-06 ENCOUNTER — Other Ambulatory Visit: Payer: Medicaid Other

## 2021-12-07 ENCOUNTER — Ambulatory Visit (INDEPENDENT_AMBULATORY_CARE_PROVIDER_SITE_OTHER): Payer: Medicaid Other | Admitting: Family Medicine

## 2021-12-07 ENCOUNTER — Ambulatory Visit
Admission: RE | Admit: 2021-12-07 | Discharge: 2021-12-07 | Disposition: A | Discharge status: Home / Self Care | Payer: Medicaid Other | Source: Ambulatory Visit

## 2021-12-07 ENCOUNTER — Other Ambulatory Visit: Payer: Self-pay

## 2021-12-07 DIAGNOSIS — O039 Complete or unspecified spontaneous abortion without complication: Secondary | ICD-10-CM

## 2021-12-07 DIAGNOSIS — Z3A01 Less than 8 weeks gestation of pregnancy: Secondary | ICD-10-CM | POA: Insufficient documentation

## 2021-12-07 DIAGNOSIS — Z3689 Encounter for other specified antenatal screening: Secondary | ICD-10-CM | POA: Diagnosis not present

## 2021-12-07 DIAGNOSIS — Z3491 Encounter for supervision of normal pregnancy, unspecified, first trimester: Secondary | ICD-10-CM | POA: Insufficient documentation

## 2021-12-07 HISTORY — DX: Complete or unspecified spontaneous abortion without complication: O03.9

## 2021-12-07 NOTE — Assessment & Plan Note (Signed)
US shows no retained POC and normal uterus c/w completed SAB.  ?Reassured patient that miscarriage is common with ~1/4 of women experiencing it in their lifetime. Reassured patient that there is nothing she did or did not do to cause this. Reviewed most common reason is presumed to be genetic abnormalities that allow a pregnancy to start but not continue past an early stage, but realistically we do not know the cause in most cases. Reviewed that studies show no definite difference between attempting another pregnancy sooner vs waiting, though some studies do show better live birth outcomes with trying sooner.  ?Not interested in birth control at present, referred to bedsider. ?

## 2021-12-07 NOTE — Progress Notes (Signed)
? ?GYNECOLOGY OFFICE VISIT NOTE ? ?History:  ? Paula Nunez is a 21 y.o. G2P1001 here today for [place] result. ? ?Seen in MAU on 11/26/2021, diagnosed with SAB and given cytotec ?Follow up US today to ensure no reatined POC ? ?Reports no longer bleeding or having belly pain ?Not sure if she wants to be on birth control right now ?Is considering getting pregnant again soon ? ?Health Maintenance Due  ?Topic Date Due  ? COVID-19 Vaccine (1) Never done  ? HPV VACCINES (1 - 2-dose series) Never done  ? INFLUENZA VACCINE  04/25/2021  ? ? ?Past Medical History:  ?Diagnosis Date  ? Gestational diabetes 11/08/2020  ? Gestational hypertension 11/09/2020  ? ? ?Past Surgical History:  ?Procedure Laterality Date  ? NO PAST SURGERIES    ? ? ?The following portions of the patient's history were reviewed and updated as appropriate: allergies, current medications, past family history, past medical history, past social history, past surgical history and problem list.  ? ?Health Maintenance:   ?Last pap: ?Lab Results  ?Component Value Date  ? DIAGPAP  07/20/2021  ?  - Negative for intraepithelial lesion or malignancy (NILM)  ? ?Up to date ? ?Last mammogram:  ?N/a  ? ? ?Review of Systems:  ?Pertinent items noted in HPI and remainder of comprehensive ROS otherwise negative. ? ?Physical Exam:  ?BP 130/68   Pulse 72   Wt 152 lb 4.8 oz (69.1 kg)   LMP 09/28/2021 (Exact Date)   Breastfeeding No   BMI 26.14 kg/m?  ?CONSTITUTIONAL: Well-developed, well-nourished female in no acute distress.  ?HEENT:  Normocephalic, atraumatic. External right and left ear normal. No scleral icterus.  ?NECK: Normal range of motion, supple, no masses noted on observation ?SKIN: No rash noted. Not diaphoretic. No erythema. No pallor. ?MUSCULOSKELETAL: Normal range of motion. No edema noted. ?NEUROLOGIC: Alert and oriented to person, place, and time. Normal muscle tone coordination.  ?PSYCHIATRIC: Normal mood and affect. Normal behavior. Normal judgment and  thought content. ?RESPIRATORY: Effort normal, no problems with respiration noted ? ? ?Labs and Imaging ?No results found for this or any previous visit (from the past 168 hour(s)). ?US OB Transvaginal ? ?Result Date: 12/07/2021 ?CLINICAL DATA:  Viability scan. EXAM: OBSTETRIC <14 WK ULTRASOUND TECHNIQUE: Transabdominal ultrasound was performed for evaluation of the gestation as well as the maternal uterus and adnexal regions. COMPARISON:  November 26, 2021 FINDINGS: Intrauterine gestational sac: None Yolk sac:  Not Visualized. Embryo:  Not Visualized. Cardiac Activity: Not Visualized. Subchorionic hemorrhage:  None visualized. Maternal uterus/adnexae: Uterus and adnexa appear unremarkable. IMPRESSION: No intrauterine pregnancy identified, with the previously visualized intrauterine gestational sac no longer identified on today's study, consistent with failed pregnancy. Electronically Signed   By: Maudry Mayhew M.D.   On: 12/07/2021 15:15  ? ?US OB Transvaginal ? ?Result Date: 11/26/2021 ?CLINICAL DATA:  Bleeding, passing clots. EXAM: TRANSVAGINAL OB ULTRASOUND TECHNIQUE: Transvaginal ultrasound was performed for complete evaluation of the gestation as well as the maternal uterus, adnexal regions, and pelvic cul-de-sac. COMPARISON:  11/26/2021. FINDINGS: Intrauterine gestational sac: None. Yolk sac:  No Embryo:  No Cardiac Activity: No Heart Rate: None. Maternal uterus/adnexae: No evidence of intrauterine gestational sac. The endometrium is thickened measuring 2.4 cm, heterogeneous and internal vascularity is noted. The right ovary is within normal limits. A corpus luteal cyst is noted in the left ovary. No free fluid in the pelvis. IMPRESSION: No evidence of intrauterine pregnancy. The endometrium is thickened and heterogeneous with internal vascularity suggesting retained  products of conception. Electronically Signed   By: Thornell Sartorius M.D.   On: 11/26/2021 23:05  ? ?US OB LESS THAN 14 WEEKS WITH OB  TRANSVAGINAL ? ?Result Date: 11/26/2021 ?CLINICAL DATA:  Vaginal bleeding during pregnancy. EXAM: OBSTETRIC <14 WK [place] AND TRANSVAGINAL OB US TECHNIQUE: Both transabdominal and transvaginal ultrasound examinations were performed for complete evaluation of the gestation as well as the maternal uterus, adnexal regions, and pelvic cul-de-sac. Transvaginal technique was performed to assess early pregnancy. COMPARISON:  None. FINDINGS: Intrauterine gestational sac: Single Yolk sac:  Not Visualized. Embryo:  Visualized. Cardiac Activity: Not Visualized. Heart Rate: 0 bpm MSD: 12.8 mm   6 w   0 d CRL:  1.5 mm.  Too small to calculate gestational age. Subchorionic hemorrhage:  None visualized. Maternal uterus/adnexae: Bilateral ovaries appear within normal limits. No free fluid. Additional findings: There is a 1.3 x 1.3 x 1.1 cm heterogeneous nonvascular area in the lower uterine segment endometrium which appears complex and cystic. IMPRESSION: 1. Single intrauterine gestation. Discordant crown-rump length and mean gestational sac diameter. No cardiac activity identified. Findings are suspicious but not yet definitive for failed pregnancy given small crown-rump length. Recommend follow-up US in 10-14 days for definitive diagnosis. This recommendation follows SRU consensus guidelines: Diagnostic Criteria for Nonviable Pregnancy Early in the First Trimester. Malva Limes Med 2013; 115:7262-03. 2. 1.3 x 1.3 x 1.1 cm heterogeneous nonvascular area in the lower uterine segment endometrium, indeterminate. Findings may represent clot. Other etiologies such as endometrial mass can not be entirely excluded. Electronically Signed   By: Darliss Cheney M.D.   On: 11/26/2021 15:19      ?Assessment and Plan:  ? ?Problem List Items Addressed This Visit   ? ?  ? Other  ? SAB (spontaneous abortion)  ?  US shows no retained POC and normal uterus c/w completed SAB.  ?Reassured patient that miscarriage is common with ~1/4 of women experiencing it in  their lifetime. Reassured patient that there is nothing she did or did not do to cause this. Reviewed most common reason is presumed to be genetic abnormalities that allow a pregnancy to start but not continue past an early stage, but realistically we do not know the cause in most cases. Reviewed that studies show no definite difference between attempting another pregnancy sooner vs waiting, though some studies do show better live birth outcomes with trying sooner.  ?Not interested in birth control at present, referred to bedsider. ?  ?  ? ? ?Routine preventative health maintenance measures emphasized. ?Please refer to After Visit Summary for other counseling recommendations.  ? ?Return for as needed.   ? ?Total face-to-face time with patient: 15 minutes.  Over 50% of encounter was spent on counseling and coordination of care. ? ? ?Venora Maples, MD/MPH ?Attending Family Medicine Physician, Faculty Practice ?Center for [employer], [facility] ? ?

## 2021-12-12 ENCOUNTER — Ambulatory Visit: Payer: Medicaid Other | Admitting: Family Medicine

## 2021-12-12 ENCOUNTER — Encounter: Payer: Medicaid Other | Admitting: Family Medicine

## 2022-01-23 ENCOUNTER — Ambulatory Visit: Payer: Medicaid Other

## 2022-01-26 ENCOUNTER — Ambulatory Visit: Payer: Medicaid Other

## 2022-02-01 ENCOUNTER — Ambulatory Visit (INDEPENDENT_AMBULATORY_CARE_PROVIDER_SITE_OTHER): Payer: Medicaid Other

## 2022-02-01 DIAGNOSIS — Z3687 Encounter for antenatal screening for uncertain dates: Secondary | ICD-10-CM

## 2022-02-01 DIAGNOSIS — Z3201 Encounter for pregnancy test, result positive: Secondary | ICD-10-CM

## 2022-02-01 LAB — POCT PREGNANCY, URINE: Preg Test, Ur: POSITIVE — AB

## 2022-02-01 NOTE — Progress Notes (Signed)
Patient dropped off urine today for pregnancy test. Urine pregnancy test positive. I called patient to review this with her. Per patient she had a positive pregnancy test on 01/30/22 at home. Patient denies any pain or vaginal bleeding. Patient states she does not know when her LMP was because she recently had a miscarriage. I reviewed this with Dr. Crissie Reese. Per Dr. Crissie Reese patient to have a dating ultrasound and have beta hCG drawn due to recent miscarriage. Ultrasound scheduled and beta hCG appointment made. I reviewed all of this with the patient. I reviewed ectopic and bleeding precautions with the patient. I recommended patient begin taking a prenatal vitamin. I also sent patient safe medication list over mychart. Patient verbalized understanding and denies any other questions.  ? ?Alesia Richards, RN ?02/01/22 ?

## 2022-02-01 NOTE — Patient Instructions (Signed)

## 2022-02-07 ENCOUNTER — Ambulatory Visit (INDEPENDENT_AMBULATORY_CARE_PROVIDER_SITE_OTHER): Payer: Medicaid Other

## 2022-02-07 ENCOUNTER — Other Ambulatory Visit: Payer: Self-pay | Admitting: Certified Nurse Midwife

## 2022-02-07 ENCOUNTER — Ambulatory Visit: Payer: Medicaid Other

## 2022-02-07 DIAGNOSIS — Z3687 Encounter for antenatal screening for uncertain dates: Secondary | ICD-10-CM | POA: Diagnosis not present

## 2022-02-14 ENCOUNTER — Ambulatory Visit: Payer: Medicaid Other | Admitting: Family Medicine

## 2022-02-14 ENCOUNTER — Telehealth (INDEPENDENT_AMBULATORY_CARE_PROVIDER_SITE_OTHER): Payer: Medicaid Other

## 2022-02-14 DIAGNOSIS — Z8759 Personal history of other complications of pregnancy, childbirth and the puerperium: Secondary | ICD-10-CM | POA: Insufficient documentation

## 2022-02-14 DIAGNOSIS — Z3A00 Weeks of gestation of pregnancy not specified: Secondary | ICD-10-CM

## 2022-02-14 DIAGNOSIS — Z3490 Encounter for supervision of normal pregnancy, unspecified, unspecified trimester: Secondary | ICD-10-CM

## 2022-02-14 DIAGNOSIS — Z3491 Encounter for supervision of normal pregnancy, unspecified, first trimester: Secondary | ICD-10-CM | POA: Insufficient documentation

## 2022-02-14 DIAGNOSIS — O219 Vomiting of pregnancy, unspecified: Secondary | ICD-10-CM

## 2022-02-14 DIAGNOSIS — O09899 Supervision of other high risk pregnancies, unspecified trimester: Secondary | ICD-10-CM | POA: Insufficient documentation

## 2022-02-14 DIAGNOSIS — O09299 Supervision of pregnancy with other poor reproductive or obstetric history, unspecified trimester: Secondary | ICD-10-CM | POA: Insufficient documentation

## 2022-02-14 MED ORDER — DOXYLAMINE-PYRIDOXINE 10-10 MG PO TBEC: 2.0000 | DELAYED_RELEASE_TABLET | Freq: Every evening | ORAL | 2 refills | Status: DC | PRN

## 2022-02-14 MED ORDER — PRENATAL ADULT GUMMY/DHA/FA 0.4-25 MG PO CHEW: 1.0000 | CHEWABLE_TABLET | Freq: Every day | ORAL | 11 refills | Status: DC

## 2022-02-14 NOTE — Progress Notes (Signed)
New OB Intake  I connected with  Paula Nunez on 02/14/22 at  2:15 PM EDT by MyChart Video Visit and verified that I am speaking with the correct person using two identifiers. Nurse is located at [facility] and pt is located at home.  I discussed the limitations, risks, security and privacy concerns of performing an evaluation and management service by telephone and the availability of in person appointments. I also discussed with the patient that there may be a patient responsible charge related to this service. The patient expressed understanding and agreed to proceed.  I explained I am completing New OB Intake today. We discussed her EDD of 09/15/22 that is based on [place] at 8w 4d Pt is G3/P1011. I reviewed her allergies, medications, Medical/Surgical/OB history, and appropriate screenings. [facility] information placed in AVS. Based on history, this is an uncomplicated pregnancy.  Patient Active Problem List   Diagnosis Date Noted   Supervision of low-risk pregnancy, first trimester 02/14/2022   History of gestational hypertension 02/14/2022   History of gestational diabetes mellitus (GDM) in prior pregnancy, currently pregnant 02/14/2022   Alpha thalassemia silent carrier 09/11/2020   Concerns addressed today Nausea and vomiting: Patient reports treating nausea and vomiting with Marijuana. Diclegis sent per protocol for nausea. Encouraged pt to discontinue Marijuana use for remainder of pregnancy.  Delivery Plans:  Plans to deliver at [facility].   MyChart/Babyscripts MyChart access verified. Babyscripts instructions given and order placed.  Blood Pressure Cuff  Has BP cuff. Explained after first prenatal appt pt will check weekly and document in Babyscripts.  Anatomy US Explained first scheduled [place] will be around 19 weeks. Anatomy US scheduled for 04/21/22 at 10:15 AM. Scheduled AFP lab only appointment for same day.  Labs Discussed Paula Nunez genetic screening with patient; desires Cindy Hazy,  carrier screen previously completed. Routine prenatal labs needed. PAP not needed. Will need CMP, urine protein/creatinine ratio, and A1C due to history.  COVID Vaccine Patient has not had COVID vaccine.   Is patient a CenteringPregnancy candidate?  Accepted   Is patient a Mom+Baby Combined Care candidate?  Not a candidate; not first child.  Social Determinants of Health Food Insecurity: Patient denies food insecurity. WIC Referral: Patient already enrolled in [facility]. Transportation: Patient denies transportation needs. Childcare: Discussed no children allowed at ultrasound appointments. Offered childcare services; patient declines childcare services at this time.  First visit review I reviewed new OB appt with pt. I explained she will have provider visit with Debroah Loop, MD that includes physical exam and OB labs. Encounter routed to appropriate provider. Explained that patient will be seen by pregnancy navigator following visit with provider.   Marjo Bicker, RN 02/14/2022  3:49 PM

## 2022-02-14 NOTE — Patient Instructions (Signed)
  At our [facility], we work as an integrated team, providing care to address both physical and emotional health. Your medical provider may refer you to see our Behavioral Health Clinician (BHC) on the same day you see your medical provider, as availability permits; often scheduled virtually at your convenience.  Our BHC is available to all patients, visits generally last between 20-30 minutes, but can be longer or shorter, depending on patient need. The BHC offers help with stress management, coping with symptoms of depression and anxiety, major life changes , sleep issues, changing risky behavior, grief and loss, life stress, working on personal life goals, and  behavioral health issues, as these all affect your overall health and wellness.  The BHC is NOT available for the following: FMLA paperwork, court-ordered evaluations, specialty assessments (custody or disability), letters to employers, or obtaining certification for an emotional support animal. The BHC does not provide long-term therapy. You have the right to refuse integrated behavioral health services, or to reschedule to see the BHC at a later date.  Confidentiality exception: If it is suspected that a child or disabled adult is being abused or neglected, we are required by law to report that to either Child Protective Services or Adult Protective Services.  If you have a diagnosis of Bipolar affective disorder, Schizophrenia, or recurrent Major depressive disorder, we will recommend that you establish care with a psychiatrist, as these are lifelong, chronic conditions, and we want your overall emotional health and medications to be more closely monitored. If you anticipate needing extended maternity leave due to mental health issues postpartum, it it recommended you inform your medical provider, so we can put in a referral to a psychiatrist as soon as possible. The BHC is unable to recommend an extended maternity leave for mental  health issues. Your medical provider or BHC may refer you to a therapist for ongoing, traditional therapy, or to a psychiatrist, for medication management, if it would benefit your overall health. Depending on your insurance, you may have a copay or be charged a deductible, depending on your insurance, to see the BHC. If you are uninsured, it is recommended that you apply for financial assistance. (Forms may be requested at the front desk for in-person visits, via MyChart, or request a form during a virtual visit).  If you see the BHC more than 6 times, you will have to complete a comprehensive clinical assessment interview with the BHC to resume integrated services.  For virtual visits with the BHC, you must be physically in the state of [place] at the time of the visit. For example, if you live in [place], you will have to do an in-person visit with the BHC, and your out-of-state insurance may not cover behavioral health services in [place]. If you are going out of the state or country for any reason, the BHC may see you virtually when you return to [place], but not while you are physically outside of [place].    

## 2022-03-08 ENCOUNTER — Telehealth: Payer: Self-pay | Admitting: *Deleted

## 2022-03-08 ENCOUNTER — Encounter: Payer: Self-pay | Admitting: *Deleted

## 2022-03-08 NOTE — Telephone Encounter (Signed)
During chart review noted  DNKA original new ob and rescheduled 03/13/22. I called and left message to remind her of appointment 6/19 and that we are canceling her appt 03/14/22 ( centering - because does not need another prenatal visit 6/20 after new ob 6/19) and will send a detailed MyChart message. Staci Acosta

## 2022-03-13 ENCOUNTER — Encounter: Payer: Medicaid Other | Admitting: Obstetrics & Gynecology

## 2022-03-15 ENCOUNTER — Other Ambulatory Visit: Payer: Self-pay

## 2022-03-15 ENCOUNTER — Inpatient Hospital Stay (HOSPITAL_COMMUNITY)
Admission: AD | Admit: 2022-03-15 | Discharge: 2022-03-16 | Disposition: A | Discharge status: Home / Self Care | Payer: Medicaid Other | Attending: Obstetrics & Gynecology | Admitting: Obstetrics & Gynecology

## 2022-03-15 ENCOUNTER — Encounter (HOSPITAL_COMMUNITY): Payer: Self-pay | Admitting: Obstetrics and Gynecology

## 2022-03-15 DIAGNOSIS — O09291 Supervision of pregnancy with other poor reproductive or obstetric history, first trimester: Secondary | ICD-10-CM | POA: Diagnosis not present

## 2022-03-15 DIAGNOSIS — R109 Unspecified abdominal pain: Secondary | ICD-10-CM | POA: Diagnosis not present

## 2022-03-15 DIAGNOSIS — O26899 Other specified pregnancy related conditions, unspecified trimester: Secondary | ICD-10-CM

## 2022-03-15 DIAGNOSIS — Z3A13 13 weeks gestation of pregnancy: Secondary | ICD-10-CM | POA: Diagnosis not present

## 2022-03-15 DIAGNOSIS — O26891 Other specified pregnancy related conditions, first trimester: Secondary | ICD-10-CM | POA: Diagnosis not present

## 2022-03-15 DIAGNOSIS — O2342 Unspecified infection of urinary tract in pregnancy, second trimester: Secondary | ICD-10-CM | POA: Diagnosis not present

## 2022-03-15 DIAGNOSIS — Z3A14 14 weeks gestation of pregnancy: Secondary | ICD-10-CM | POA: Diagnosis not present

## 2022-03-15 MED ORDER — ACETAMINOPHEN 500 MG PO TABS
1000.0000 mg | ORAL_TABLET | Freq: Four times a day (QID) | ORAL | Status: DC | PRN
  Administered 2022-03-16: 1000 mg via ORAL

## 2022-03-15 NOTE — MAU Note (Signed)
..  Paula Nunez is a 21 y.o. at [redacted]w[redacted]d here in MAU reporting: constant abdominal cramping that began earlier today. Has not taken any medication for the pain Denies vaginal bleeding or abnormal vaginal discharge.   Onset of complaint: today Pain score: 10/10 Vitals:   03/15/22 2317  BP: (!) 120/54  Pulse: 87  Resp: 18  Temp: 98.5 F (36.9 C)  SpO2: 98%     [id number] Lab orders placed from triage:  UA

## 2022-03-16 DIAGNOSIS — Z3A13 13 weeks gestation of pregnancy: Secondary | ICD-10-CM | POA: Diagnosis not present

## 2022-03-16 DIAGNOSIS — R109 Unspecified abdominal pain: Secondary | ICD-10-CM | POA: Diagnosis not present

## 2022-03-16 DIAGNOSIS — O26899 Other specified pregnancy related conditions, unspecified trimester: Secondary | ICD-10-CM | POA: Diagnosis not present

## 2022-03-16 LAB — CBC
HCT: 33.7 % — ABNORMAL LOW (ref 36.0–46.0)
Hemoglobin: 11.1 g/dL — ABNORMAL LOW (ref 12.0–15.0)
MCH: 26.2 pg (ref 26.0–34.0)
MCHC: 32.9 g/dL (ref 30.0–36.0)
MCV: 79.5 fL — ABNORMAL LOW (ref 80.0–100.0)
Platelets: 242 10*3/uL (ref 150–400)
RBC: 4.24 MIL/uL (ref 3.87–5.11)
RDW: 13.8 % (ref 11.5–15.5)
WBC: 8.1 10*3/uL (ref 4.0–10.5)
nRBC: 0 % (ref 0.0–0.2)

## 2022-03-16 LAB — URINALYSIS, ROUTINE W REFLEX MICROSCOPIC
Bilirubin Urine: NEGATIVE
Glucose, UA: NEGATIVE mg/dL
Ketones, ur: 5 mg/dL — AB
Nitrite: NEGATIVE
Protein, ur: 100 mg/dL — AB
RBC / HPF: >50 RBC/hpf — ABNORMAL HIGH (ref 0–5)
Specific Gravity, Urine: 1.021 (ref 1.005–1.030)
WBC, UA: >50 WBC/hpf — ABNORMAL HIGH (ref 0–5)
pH: 7 (ref 5.0–8.0)

## 2022-03-16 NOTE — Discharge Instructions (Signed)

## 2022-03-16 NOTE — MAU Provider Note (Signed)
History     CSN: [id number]  Arrival date and time: 03/15/22 2306   Event Date/Time   First Provider Initiated Contact with Patient 03/15/22 2350      Chief Complaint  Patient presents with   Abdominal Pain   21 y.o. G3P1011 @13 .5 wks presenting with LAP. Reports onset around 3pm today. Describes as worse than period-like cramping in her RLQ. Rates pain 10/10. Has not treated it. Denies urinary sx. Denies VB or discharge. Denies GI sx other than nausea. No fevers.     OB History     Gravida  3   Para  1   Term  1   Preterm      AB  1   Living  1      SAB  1   IAB      Ectopic      Multiple  0   Live Births  1           Past Medical History:  Diagnosis Date   Gestational diabetes 11/08/2020   Gestational hypertension 11/09/2020   SAB (spontaneous abortion) 12/07/2021    Past Surgical History:  Procedure Laterality Date   NO PAST SURGERIES      Family History  Problem Relation Age of Onset   Healthy Mother    Healthy Father    Hypertension Maternal Grandmother     Social History   Tobacco Use   Smoking status: Never   Smokeless tobacco: Never  Vaping Use   Vaping Use: Never used  Substance Use Topics   Alcohol use: Not Currently    Alcohol/week: 1.0 standard drink of alcohol    Types: 1 Glasses of wine per week   Drug use: Yes    Types: Marijuana    Allergies: No Known Allergies  Medications Prior to Admission  Medication Sig Dispense Refill Last Dose   Prenatal MV & Min w/FA-DHA (PRENATAL ADULT GUMMY/DHA/FA) 0.4-25 MG CHEW Chew 1 Dose by mouth daily. 30 tablet 11 03/15/2022   Doxylamine-Pyridoxine (DICLEGIS) 10-10 MG TBEC Take 2 tablets by mouth at bedtime as needed. May add 1 tablet at breakfast and 1 tablet at lunch if needed. 100 tablet 2     Review of Systems  Constitutional:  Negative for chills and fever.  Gastrointestinal:  Positive for abdominal pain and nausea. Negative for constipation, diarrhea and vomiting.   Genitourinary:  Negative for dysuria, hematuria, urgency, vaginal bleeding and vaginal discharge.   Physical Exam   Blood pressure (!) 120/54, pulse 87, temperature 98.5 F (36.9 C), temperature source Oral, resp. rate 18, height 5\' 5"  (1.651 m), weight 69.5 kg, last menstrual period 09/28/2021, SpO2 98 %, unknown if currently breastfeeding.  Physical Exam Vitals and nursing note reviewed. Exam conducted with a chaperone present.  Constitutional:      General: She is not in acute distress (appears comfortable).    Appearance: Normal appearance.  HENT:     Head: Normocephalic and atraumatic.  Cardiovascular:     Rate and Rhythm: Normal rate.  Pulmonary:     Effort: Pulmonary effort is normal. No respiratory distress.  Abdominal:     General: There is no distension.     Palpations: Abdomen is soft. There is no mass.     Tenderness: There is no abdominal tenderness. There is no guarding or rebound.     Hernia: No hernia is present.  Genitourinary:    Comments: VE: closed/long Musculoskeletal:        General: Normal range  of motion.     Cervical back: Normal range of motion.  Skin:    General: Skin is warm and dry.  Neurological:     General: No focal deficit present.     Mental Status: She is alert and oriented to person, place, and time.  Psychiatric:        Mood and Affect: Mood normal.        Behavior: Behavior normal.   FHT 165  Limited bedside US: viable, active fetus, +cardiac activity, subj. nml AFV  Results for orders placed or performed during the hospital encounter of 03/15/22 (from the past 24 hour(s))  Urinalysis, Routine w reflex microscopic Urine, Clean Catch     Status: Abnormal   Collection Time: 03/15/22 11:58 PM  Result Value Ref Range   Color, Urine YELLOW YELLOW   APPearance CLOUDY (A) CLEAR   Specific Gravity, Urine 1.021 1.005 - 1.030   pH 7.0 5.0 - 8.0   Glucose, UA NEGATIVE NEGATIVE mg/dL   Hgb urine dipstick MODERATE (A) NEGATIVE   Bilirubin  Urine NEGATIVE NEGATIVE   Ketones, ur 5 (A) NEGATIVE mg/dL   Protein, ur 932 (A) NEGATIVE mg/dL   Nitrite NEGATIVE NEGATIVE   Leukocytes,Ua LARGE (A) NEGATIVE   RBC / HPF >50 (H) 0 - 5 RBC/hpf   WBC, UA >50 (H) 0 - 5 WBC/hpf   Bacteria, UA FEW (A) NONE SEEN   Squamous Epithelial / LPF 11-20 0 - 5   Mucus PRESENT   CBC     Status: Abnormal   Collection Time: 03/16/22 12:45 AM  Result Value Ref Range   WBC 8.1 4.0 - 10.5 K/uL   RBC 4.24 3.87 - 5.11 MIL/uL   Hemoglobin 11.1 (L) 12.0 - 15.0 g/dL   HCT 35.5 (L) 73.2 - 20.2 %   MCV 79.5 (L) 80.0 - 100.0 fL   MCH 26.2 26.0 - 34.0 pg   MCHC 32.9 30.0 - 36.0 g/dL   RDW 54.2 70.6 - 23.7 %   Platelets 242 150 - 400 K/uL   nRBC 0.0 0.0 - 0.2 %    MAU Course  Procedures Tylenol  MDM Labs ordered and reviewed. No signs of acute process or threatened SAB. Pain improved. UA with leuks, wbcs, and bacteria, culture ordered. Stable for discharge home.   Assessment and Plan   1. [redacted] weeks gestation of pregnancy   2. Abdominal pain affecting pregnancy    Discharge home [facility] as scheduled SAB precautions Tylenol/heating pad prn  Allergies as of 03/16/2022   No Known Allergies      Medication List     TAKE these medications    Doxylamine-Pyridoxine 10-10 MG Tbec Commonly known as: Diclegis Take 2 tablets by mouth at bedtime as needed. May add 1 tablet at breakfast and 1 tablet at lunch if needed.   Prenatal Adult Gummy/DHA/FA 0.4-25 MG Chew Chew 1 Dose by mouth daily.       Donette Larry, CNM 03/16/2022, 1:41 AM

## 2022-03-17 ENCOUNTER — Encounter (HOSPITAL_COMMUNITY): Payer: Self-pay | Admitting: Obstetrics and Gynecology

## 2022-03-17 ENCOUNTER — Encounter: Payer: Self-pay | Admitting: Certified Nurse Midwife

## 2022-03-17 ENCOUNTER — Inpatient Hospital Stay (HOSPITAL_COMMUNITY)
Admission: AD | Admit: 2022-03-17 | Discharge: 2022-03-17 | Disposition: A | Discharge status: Home / Self Care | Payer: Medicaid Other | Attending: Obstetrics and Gynecology | Admitting: Obstetrics and Gynecology

## 2022-03-17 DIAGNOSIS — O26892 Other specified pregnancy related conditions, second trimester: Secondary | ICD-10-CM | POA: Diagnosis present

## 2022-03-17 DIAGNOSIS — M545 Low back pain, unspecified: Secondary | ICD-10-CM | POA: Insufficient documentation

## 2022-03-17 DIAGNOSIS — N390 Urinary tract infection, site not specified: Secondary | ICD-10-CM | POA: Insufficient documentation

## 2022-03-17 DIAGNOSIS — Z3A14 14 weeks gestation of pregnancy: Secondary | ICD-10-CM

## 2022-03-17 DIAGNOSIS — O2342 Unspecified infection of urinary tract in pregnancy, second trimester: Secondary | ICD-10-CM | POA: Insufficient documentation

## 2022-03-17 DIAGNOSIS — O218 Other vomiting complicating pregnancy: Secondary | ICD-10-CM | POA: Diagnosis not present

## 2022-03-17 HISTORY — DX: Headache, unspecified: R51.9

## 2022-03-17 HISTORY — DX: Depression, unspecified: F32.A

## 2022-03-17 HISTORY — DX: Anxiety disorder, unspecified: F41.9

## 2022-03-17 LAB — URINALYSIS, ROUTINE W REFLEX MICROSCOPIC
Bilirubin Urine: NEGATIVE
Glucose, UA: NEGATIVE mg/dL
Ketones, ur: 80 mg/dL — AB
Nitrite: POSITIVE — AB
Protein, ur: 100 mg/dL — AB
Specific Gravity, Urine: 1.02 (ref 1.005–1.030)
WBC, UA: >50 WBC/hpf — ABNORMAL HIGH (ref 0–5)
pH: 5 (ref 5.0–8.0)

## 2022-03-17 MED ORDER — ONDANSETRON 4 MG PO TBDP
4.0000 mg | ORAL_TABLET | Freq: Once | ORAL | Status: AC
  Administered 2022-03-17: 4 mg via ORAL
  Filled 2022-03-17: qty 1

## 2022-03-17 MED ORDER — CEFTRIAXONE SODIUM 500 MG IJ SOLR
500.0000 mg | Freq: Once | INTRAMUSCULAR | Status: AC
  Administered 2022-03-17: 500 mg via INTRAMUSCULAR
  Filled 2022-03-17: qty 500

## 2022-03-17 MED ORDER — CEFADROXIL 500 MG PO CAPS: 500.0000 mg | ORAL_CAPSULE | Freq: Two times a day (BID) | ORAL | 0 refills | Status: AC

## 2022-03-17 MED ORDER — ONDANSETRON 4 MG PO TBDP: 4.0000 mg | ORAL_TABLET | Freq: Three times a day (TID) | ORAL | 0 refills | Status: DC | PRN

## 2022-03-17 NOTE — MAU Note (Signed)
Paula Nunez is a 21 y.o. at [redacted]w[redacted]d here in MAU reporting: has been throwing up since this morning.  Thinks she is a little or a lot dehydrated.   Having back pain, started rt flank area, now across entire lower back. Denies bleeding, diarrhea or constipation, no urinay problems.  Onset of complaint: yesterday Pain score: 10 Vitals:   03/17/22 1316  BP: (!) 123/48  Pulse: 99  Resp: 17  Temp: 99.4 F (37.4 C)  SpO2: 100%     FHT:166 Lab orders placed from triage:  urine

## 2022-03-18 LAB — CULTURE, OB URINE: Culture: >=100000 — AB

## 2022-04-03 ENCOUNTER — Telehealth: Payer: Self-pay | Admitting: General Practice

## 2022-04-03 NOTE — Telephone Encounter (Signed)
Called patient to review upcoming centering appts and start time information. Patient verbalized understanding and had no questions.

## 2022-04-06 ENCOUNTER — Encounter: Payer: Medicaid Other | Admitting: Obstetrics & Gynecology

## 2022-04-10 DIAGNOSIS — O2342 Unspecified infection of urinary tract in pregnancy, second trimester: Secondary | ICD-10-CM | POA: Insufficient documentation

## 2022-04-10 NOTE — Progress Notes (Unsigned)
INITIAL PRENATAL VISIT  Subjective:   Paula Nunez is being seen today for her first obstetrical visit.  This {is/is not:9024} a planned pregnancy. This {is/is not:9024} a desired pregnancy.  She is at [redacted]w[redacted]d gestation by 8.4 week [place]. Her obstetrical history is significant for pregnancy induced hypertension and A1GDM . Relationship with FOB: {fob:16621}. Patient {does/does not:19097} intend to breast feed. Pregnancy history fully reviewed.  Patient reports {sx:14538}.  Review of Systems:   Review of Systems  Objective:    Obstetric History OB History  Gravida Para Term Preterm AB Living  3 1 1   1 1   SAB IAB Ectopic Multiple Live Births  1     0 1    # Outcome Date GA Lbr Len/2nd Weight Sex Delivery Anes PTL Lv  3 Current           2 SAB 2023          1 Term 11/09/20 [redacted]w[redacted]d 06:18 / 00:10 7 lb 1.2 oz (3.209 kg) M Vag-Spont EPI  LIV    Past Medical History:  Diagnosis Date   Anxiety    Depression    doing ok now   Gestational diabetes 11/08/2020   Gestational hypertension 11/09/2020   Headache    SAB (spontaneous abortion) 12/07/2021    Past Surgical History:  Procedure Laterality Date   NO PAST SURGERIES      Current Outpatient Medications on File Prior to Visit  Medication Sig Dispense Refill   acetaminophen (TYLENOL) 325 MG tablet Take 325 mg by mouth every 6 (six) hours as needed.     Doxylamine-Pyridoxine (DICLEGIS) 10-10 MG TBEC Take 2 tablets by mouth at bedtime as needed. May add 1 tablet at breakfast and 1 tablet at lunch if needed. 100 tablet 2   ondansetron (ZOFRAN-ODT) 4 MG disintegrating tablet Take 1 tablet (4 mg total) by mouth every 8 (eight) hours as needed for nausea or vomiting. 15 tablet 0   Prenatal MV & Min w/FA-DHA (PRENATAL ADULT GUMMY/DHA/FA) 0.4-25 MG CHEW Chew 1 Dose by mouth daily. 30 tablet 11   No current facility-administered medications on file prior to visit.    No Known Allergies  Social History:  reports that she has never  smoked. She has never used smokeless tobacco. She reports that she does not currently use alcohol after a past usage of about 1.0 standard drink of alcohol per week. She reports that she does not currently use drugs after having used the following drugs: Marijuana.  Family History  Problem Relation Age of Onset   Healthy Mother    Healthy Father    Hypertension Maternal Grandmother     The following portions of the patient's history were reviewed and updated as appropriate: allergies, current medications, past family history, past medical history, past social history, past surgical history and problem list.  Review of Systems Review of Systems    Physical Exam:  LMP 09/28/2021 (Exact Date)  CONSTITUTIONAL: Well-developed, well-nourished female in no acute distress.  HENT:  Normocephalic, atraumatic. Oropharynx is clear and moist EYES: Conjunctivae normal. No scleral icterus.  NECK: Normal range of motion. SKIN: Skin is warm and dry. No rash noted. Not diaphoretic. No erythema. No pallor. MUSCULOSKELETAL: Normal range of motion. No tenderness.  No cyanosis, clubbing, or edema.   NEUROLOGIC: Alert and oriented to person, place, and time. Normal muscle tone coordination.  PSYCHIATRIC: Normal mood and affect. Normal behavior. Normal judgment and thought content. CARDIOVASCULAR: Normal heart rate noted, regular rhythm RESPIRATORY:  Clear to auscultation bilaterally. Effort and breath sounds normal, no problems with respiration noted. BREASTS: Symmetric in size. No masses, skin changes, nipple drainage, or lymphadenopathy. ABDOMEN: Soft, normal bowel sounds, no distention noted.  No tenderness, rebound or guarding. Fundal ht: *** PELVIC: Normal appearing external genitalia; normal appearing vaginal mucosa and cervix.  No abnormal discharge noted.  Pap smear not obtained.  Normal uterine size, no other palpable masses, no uterine or adnexal tenderness. FHR: ***   Assessment:    Pregnancy:  G1P0000 1. Supervision of low-risk pregnancy, first trimester - NOB labs - Anatomy US 7/28  2. History of gestational hypertension - Baseline P:C Ratio  3. UTI (urinary tract infection) during pregnancy, second trimester - TOC NV  4. History of gestational diabetes mellitus (GDM) in prior pregnancy, currently pregnant - Early GTT*** vs Hgb A1C     Plan:     Initial labs drawn. Prenatal vitamins. Problem list reviewed and updated. Genetic screening discussed: NIPS/First trimester screen/Quad/AFP {requests/ordered/declines:14581}. Role of ultrasound in pregnancy discussed; Anatomy US: {requests/ordered/declines:14581}. Amniocentesis discussed: {amniocentesis:14582}. Follow up in {numbers 0-4:31231} weeks. CenteringPregnancy group 6 05/09/22 Discussed clinic routines, schedule of care and testing, genetic screening options, involvement of students and residents under the direct supervision of APPs and doctors and presence of female providers. Pt verbalized understanding.   Katrinka Blazing, [place], [place] 04/10/2022 6:30 PM

## 2022-04-11 ENCOUNTER — Other Ambulatory Visit: Payer: Self-pay

## 2022-04-11 ENCOUNTER — Ambulatory Visit (INDEPENDENT_AMBULATORY_CARE_PROVIDER_SITE_OTHER): Payer: Medicaid Other | Admitting: Advanced Practice Midwife

## 2022-04-11 ENCOUNTER — Other Ambulatory Visit (HOSPITAL_COMMUNITY)
Admission: RE | Admit: 2022-04-11 | Discharge: 2022-04-11 | Disposition: A | Discharge status: Home / Self Care | Payer: Medicaid Other | Source: Ambulatory Visit | Attending: Certified Nurse Midwife | Admitting: Certified Nurse Midwife

## 2022-04-11 VITALS — BP 122/60 | HR 86 | Wt 157.0 lb

## 2022-04-11 DIAGNOSIS — Z3491 Encounter for supervision of normal pregnancy, unspecified, first trimester: Secondary | ICD-10-CM | POA: Diagnosis not present

## 2022-04-11 DIAGNOSIS — Z8759 Personal history of other complications of pregnancy, childbirth and the puerperium: Secondary | ICD-10-CM | POA: Diagnosis not present

## 2022-04-11 DIAGNOSIS — Z3A17 17 weeks gestation of pregnancy: Secondary | ICD-10-CM | POA: Diagnosis not present

## 2022-04-11 DIAGNOSIS — O2342 Unspecified infection of urinary tract in pregnancy, second trimester: Secondary | ICD-10-CM | POA: Diagnosis not present

## 2022-04-11 DIAGNOSIS — O09299 Supervision of pregnancy with other poor reproductive or obstetric history, unspecified trimester: Secondary | ICD-10-CM

## 2022-04-11 DIAGNOSIS — Z8632 Personal history of gestational diabetes: Secondary | ICD-10-CM

## 2022-04-12 LAB — CERVICOVAGINAL ANCILLARY ONLY
Chlamydia: NEGATIVE
Comment: NEGATIVE
Comment: NEGATIVE
Comment: NORMAL
Neisseria Gonorrhea: NEGATIVE
Trichomonas: NEGATIVE

## 2022-04-13 ENCOUNTER — Telehealth: Payer: Self-pay

## 2022-04-13 LAB — CBC/D/PLT+RPR+RH+ABO+RUBIGG...
Antibody Screen: NEGATIVE
Basophils Absolute: 0.1 10*3/uL (ref 0.0–0.2)
Basos: 1 %
EOS (ABSOLUTE): 0.1 10*3/uL (ref 0.0–0.4)
Eos: 1 %
HCV Ab: NONREACTIVE
HIV Screen 4th Generation wRfx: NONREACTIVE
Hematocrit: 31.3 % — ABNORMAL LOW (ref 34.0–46.6)
Hemoglobin: 10.4 g/dL — ABNORMAL LOW (ref 11.1–15.9)
Hepatitis B Surface Ag: NEGATIVE
Immature Grans (Abs): 0.1 10*3/uL (ref 0.0–0.1)
Immature Granulocytes: 1 %
Lymphocytes Absolute: 1.3 10*3/uL (ref 0.7–3.1)
Lymphs: 19 %
MCH: 26.5 pg — ABNORMAL LOW (ref 26.6–33.0)
MCHC: 33.2 g/dL (ref 31.5–35.7)
MCV: 80 fL (ref 79–97)
Monocytes Absolute: 0.4 10*3/uL (ref 0.1–0.9)
Monocytes: 6 %
Neutrophils Absolute: 5 10*3/uL (ref 1.4–7.0)
Neutrophils: 72 %
Platelets: 284 10*3/uL (ref 150–450)
RBC: 3.92 x10E6/uL (ref 3.77–5.28)
RDW: 13.7 % (ref 11.7–15.4)
RPR Ser Ql: REACTIVE — AB
Rh Factor: POSITIVE
Rubella Antibodies, IGG: 8.28 index (ref >0.99)
WBC: 6.9 10*3/uL (ref 3.4–10.8)

## 2022-04-13 LAB — AFP, SERUM, OPEN SPINA BIFIDA
AFP MoM: 0.57
AFP Value: 25.3 ng/mL
Gest. Age on Collection Date: 17.4 weeks
Maternal Age At EDD: 22.4 yr
OSBR Risk 1 IN: 10000
Test Results:: NEGATIVE
Weight: 157 [lb_av]

## 2022-04-13 LAB — COMPREHENSIVE METABOLIC PANEL
ALT: 5 IU/L (ref 0–32)
AST: 10 IU/L (ref 0–40)
Albumin/Globulin Ratio: 1.4 (ref 1.2–2.2)
Albumin: 3.7 g/dL — ABNORMAL LOW (ref 4.0–5.0)
Alkaline Phosphatase: 53 IU/L (ref 44–121)
BUN/Creatinine Ratio: 22 (ref 9–23)
BUN: 11 mg/dL (ref 6–20)
Bilirubin Total: <0.2 mg/dL (ref 0.0–1.2)
CO2: 22 mmol/L (ref 20–29)
Calcium: 9.1 mg/dL (ref 8.7–10.2)
Chloride: 103 mmol/L (ref 96–106)
Creatinine, Ser: 0.5 mg/dL — ABNORMAL LOW (ref 0.57–1.00)
Globulin, Total: 2.7 g/dL (ref 1.5–4.5)
Glucose: 85 mg/dL (ref 70–99)
Potassium: 4.1 mmol/L (ref 3.5–5.2)
Sodium: 136 mmol/L (ref 134–144)
Total Protein: 6.4 g/dL (ref 6.0–8.5)
eGFR: 136 mL/min/{1.73_m2} (ref >59)

## 2022-04-13 LAB — RPR, QUANT+TP ABS (REFLEX)
Rapid Plasma Reagin, Quant: 1:32 {titer} — ABNORMAL HIGH
T Pallidum Abs: REACTIVE — AB

## 2022-04-13 LAB — HEMOGLOBIN A1C
Est. average glucose Bld gHb Est-mCnc: 103 mg/dL
Hgb A1c MFr Bld: 5.2 % (ref 4.8–5.6)

## 2022-04-13 LAB — HCV INTERPRETATION

## 2022-04-13 LAB — PROTEIN / CREATININE RATIO, URINE
Creatinine, Urine: 138.3 mg/dL
Protein, Ur: 10.3 mg/dL
Protein/Creat Ratio: 74 mg/g creat (ref 0–200)

## 2022-04-13 LAB — URINE CULTURE, OB REFLEX: Organism ID, Bacteria: NO GROWTH

## 2022-04-13 LAB — CULTURE, OB URINE

## 2022-04-13 NOTE — Telephone Encounter (Signed)
Call placed to pt. Spoke with pt. Pt given results of Positive RPR. Pt verbalized understanding and agreeable to plan of care. Pt scheduled for Rx Bicillin x 3 treatments starting on 7/26 at 2pm. Pt agreeable to date and time of appt. [facility] is aware and was notified.   Will notify [place],CNM of results and plan of care.   Blas Riches,RNC

## 2022-04-17 ENCOUNTER — Encounter: Payer: Self-pay | Admitting: Advanced Practice Midwife

## 2022-04-17 DIAGNOSIS — Z8619 Personal history of other infectious and parasitic diseases: Secondary | ICD-10-CM | POA: Insufficient documentation

## 2022-04-17 DIAGNOSIS — O98112 Syphilis complicating pregnancy, second trimester: Secondary | ICD-10-CM | POA: Insufficient documentation

## 2022-04-19 ENCOUNTER — Ambulatory Visit (INDEPENDENT_AMBULATORY_CARE_PROVIDER_SITE_OTHER): Payer: Medicaid Other

## 2022-04-19 ENCOUNTER — Other Ambulatory Visit: Payer: Self-pay

## 2022-04-19 VITALS — BP 115/59 | HR 71 | Wt 159.0 lb

## 2022-04-19 DIAGNOSIS — A539 Syphilis, unspecified: Secondary | ICD-10-CM | POA: Diagnosis not present

## 2022-04-19 DIAGNOSIS — Z3491 Encounter for supervision of normal pregnancy, unspecified, first trimester: Secondary | ICD-10-CM

## 2022-04-19 MED ORDER — PENICILLIN G BENZATHINE 1200000 UNIT/2ML IM SUSY
2.4000 10*6.[IU] | PREFILLED_SYRINGE | Freq: Once | INTRAMUSCULAR | Status: AC
  Administered 2022-04-19: 2.4 10*6.[IU] via INTRAMUSCULAR

## 2022-04-19 NOTE — Progress Notes (Signed)
Patient is here today to receive treatment for Syphilis. Patient will receive one dose of Bicillin per week for 3 weeks. Injection administered without issue. Patient to return in 1 week for second dose of Bicillin. Patient verbalized understanding and denies any other questions.   Alesia Richards, RN 04/19/22

## 2022-04-21 ENCOUNTER — Other Ambulatory Visit: Payer: Medicaid Other

## 2022-04-21 ENCOUNTER — Other Ambulatory Visit: Payer: Self-pay | Admitting: *Deleted

## 2022-04-21 ENCOUNTER — Ambulatory Visit: Payer: Medicaid Other | Attending: Obstetrics & Gynecology

## 2022-04-21 ENCOUNTER — Ambulatory Visit: Payer: Medicaid Other

## 2022-04-21 DIAGNOSIS — Z3A19 19 weeks gestation of pregnancy: Secondary | ICD-10-CM | POA: Insufficient documentation

## 2022-04-21 DIAGNOSIS — Z3491 Encounter for supervision of normal pregnancy, unspecified, first trimester: Secondary | ICD-10-CM | POA: Insufficient documentation

## 2022-04-21 DIAGNOSIS — D563 Thalassemia minor: Secondary | ICD-10-CM | POA: Insufficient documentation

## 2022-04-21 DIAGNOSIS — Z8759 Personal history of other complications of pregnancy, childbirth and the puerperium: Secondary | ICD-10-CM

## 2022-04-21 DIAGNOSIS — O09299 Supervision of pregnancy with other poor reproductive or obstetric history, unspecified trimester: Secondary | ICD-10-CM

## 2022-04-21 DIAGNOSIS — Z363 Encounter for antenatal screening for malformations: Secondary | ICD-10-CM | POA: Diagnosis not present

## 2022-04-21 DIAGNOSIS — O280 Abnormal hematological finding on antenatal screening of mother: Secondary | ICD-10-CM

## 2022-04-21 DIAGNOSIS — Z148 Genetic carrier of other disease: Secondary | ICD-10-CM | POA: Insufficient documentation

## 2022-04-21 DIAGNOSIS — O99012 Anemia complicating pregnancy, second trimester: Secondary | ICD-10-CM

## 2022-04-21 DIAGNOSIS — O09292 Supervision of pregnancy with other poor reproductive or obstetric history, second trimester: Secondary | ICD-10-CM | POA: Insufficient documentation

## 2022-04-26 ENCOUNTER — Ambulatory Visit: Payer: Medicaid Other

## 2022-04-27 ENCOUNTER — Ambulatory Visit: Payer: Medicaid Other

## 2022-04-28 ENCOUNTER — Other Ambulatory Visit: Payer: Self-pay

## 2022-04-28 ENCOUNTER — Ambulatory Visit (INDEPENDENT_AMBULATORY_CARE_PROVIDER_SITE_OTHER): Payer: Medicaid Other | Admitting: *Deleted

## 2022-04-28 VITALS — BP 126/59 | HR 67 | Ht 65.0 in

## 2022-04-28 DIAGNOSIS — A539 Syphilis, unspecified: Secondary | ICD-10-CM | POA: Diagnosis not present

## 2022-04-28 MED ORDER — PENICILLIN G BENZATHINE 1200000 UNIT/2ML IM SUSY
2.4000 10*6.[IU] | PREFILLED_SYRINGE | Freq: Once | INTRAMUSCULAR | Status: AC
  Administered 2022-04-28: 2.4 10*6.[IU] via INTRAMUSCULAR

## 2022-04-28 NOTE — Progress Notes (Signed)
Pt presents today for 2nd injections of Bicillin which were administered without difficulty. She tolerated well.  Pt will return in one week for 3rd and final injections.

## 2022-05-02 ENCOUNTER — Telehealth: Payer: Self-pay | Admitting: General Practice

## 2022-05-02 NOTE — Telephone Encounter (Signed)
Called patient, no answer- left message stating we are trying to reach you regarding an appt scheduled for next week. Please call us back. Will send mychart message.

## 2022-05-03 ENCOUNTER — Ambulatory Visit: Payer: Self-pay

## 2022-05-05 ENCOUNTER — Other Ambulatory Visit: Payer: Self-pay

## 2022-05-05 ENCOUNTER — Ambulatory Visit (INDEPENDENT_AMBULATORY_CARE_PROVIDER_SITE_OTHER): Payer: Medicaid Other | Admitting: General Practice

## 2022-05-05 VITALS — BP 116/51 | HR 76 | Ht 65.0 in | Wt 162.0 lb

## 2022-05-05 DIAGNOSIS — O98112 Syphilis complicating pregnancy, second trimester: Secondary | ICD-10-CM | POA: Diagnosis not present

## 2022-05-05 MED ORDER — PENICILLIN G BENZATHINE 1200000 UNIT/2ML IM SUSY
1.2000 10*6.[IU] | PREFILLED_SYRINGE | Freq: Once | INTRAMUSCULAR | Status: AC
  Administered 2022-05-05: 1.2 10*6.[IU] via INTRAMUSCULAR

## 2022-05-05 NOTE — Progress Notes (Signed)
Paula Nunez here for  Bicillin  Injection.  Injection administered without complication. Patient will return next Tuesday 8/15 for OB visit.   Marylynn Pearson, RN 05/05/2022  10:20 AM

## 2022-05-08 NOTE — Progress Notes (Signed)
No show

## 2022-05-09 ENCOUNTER — Encounter: Payer: Medicaid Other | Admitting: Women's Health

## 2022-05-09 DIAGNOSIS — O2342 Unspecified infection of urinary tract in pregnancy, second trimester: Secondary | ICD-10-CM

## 2022-05-09 DIAGNOSIS — D563 Thalassemia minor: Secondary | ICD-10-CM

## 2022-05-09 DIAGNOSIS — Z3A21 21 weeks gestation of pregnancy: Secondary | ICD-10-CM

## 2022-05-09 DIAGNOSIS — Z3491 Encounter for supervision of normal pregnancy, unspecified, first trimester: Secondary | ICD-10-CM

## 2022-05-09 DIAGNOSIS — O09299 Supervision of pregnancy with other poor reproductive or obstetric history, unspecified trimester: Secondary | ICD-10-CM

## 2022-05-09 DIAGNOSIS — O98112 Syphilis complicating pregnancy, second trimester: Secondary | ICD-10-CM

## 2022-05-09 DIAGNOSIS — Z8759 Personal history of other complications of pregnancy, childbirth and the puerperium: Secondary | ICD-10-CM

## 2022-05-17 ENCOUNTER — Encounter: Payer: Self-pay | Admitting: *Deleted

## 2022-06-01 ENCOUNTER — Inpatient Hospital Stay (HOSPITAL_COMMUNITY)
Admission: AD | Admit: 2022-06-01 | Discharge: 2022-06-01 | Disposition: A | Discharge status: Home / Self Care | Payer: Medicaid Other | Attending: Obstetrics & Gynecology | Admitting: Obstetrics & Gynecology

## 2022-06-01 ENCOUNTER — Encounter (HOSPITAL_COMMUNITY): Payer: Self-pay | Admitting: Obstetrics & Gynecology

## 2022-06-01 ENCOUNTER — Other Ambulatory Visit: Payer: Self-pay

## 2022-06-01 ENCOUNTER — Telehealth: Payer: Self-pay | Admitting: General Practice

## 2022-06-01 DIAGNOSIS — Z3A24 24 weeks gestation of pregnancy: Secondary | ICD-10-CM | POA: Diagnosis not present

## 2022-06-01 DIAGNOSIS — R102 Pelvic and perineal pain: Secondary | ICD-10-CM | POA: Diagnosis not present

## 2022-06-01 DIAGNOSIS — N760 Acute vaginitis: Secondary | ICD-10-CM

## 2022-06-01 DIAGNOSIS — O26899 Other specified pregnancy related conditions, unspecified trimester: Secondary | ICD-10-CM

## 2022-06-01 DIAGNOSIS — O26892 Other specified pregnancy related conditions, second trimester: Secondary | ICD-10-CM | POA: Diagnosis not present

## 2022-06-01 DIAGNOSIS — Z3689 Encounter for other specified antenatal screening: Secondary | ICD-10-CM

## 2022-06-01 LAB — WET PREP, GENITAL
Sperm: NONE SEEN
Trich, Wet Prep: NONE SEEN
WBC, Wet Prep HPF POC: >=10 — AB (ref <10)
Yeast Wet Prep HPF POC: NONE SEEN

## 2022-06-01 LAB — URINALYSIS, ROUTINE W REFLEX MICROSCOPIC
Bacteria, UA: NONE SEEN
Bilirubin Urine: NEGATIVE
Glucose, UA: NEGATIVE mg/dL
Ketones, ur: NEGATIVE mg/dL
Nitrite: NEGATIVE
Protein, ur: NEGATIVE mg/dL
Specific Gravity, Urine: 1.018 (ref 1.005–1.030)
pH: 6 (ref 5.0–8.0)

## 2022-06-01 MED ORDER — METRONIDAZOLE 0.75 % VA GEL: 1.0000 | Freq: Every day | VAGINAL | 0 refills | Status: DC

## 2022-06-01 MED ORDER — METRONIDAZOLE 500 MG PO TABS: 500.0000 mg | ORAL_TABLET | Freq: Two times a day (BID) | ORAL | 0 refills | Status: DC

## 2022-06-01 NOTE — MAU Provider Note (Signed)
History     CSN: [id number]  Arrival date and time: 06/01/22 2043   Event Date/Time   First Provider Initiated Contact with Patient 06/01/22 2114      Chief Complaint  Patient presents with   Pelvic Pain   Paula Nunez is a 21 y.o. G3P1011 at [redacted]w[redacted]d who receives care at [facility].  She presents today for Pelvic Pain.  She states she has been having intermittent "cramps, but way worse than that" throughout the day.  She rates the pain a 5/10 currently, but was a 10/10.  She states the pain was improved with sleep, but worsened with certain positions.  She states that she also has some pressure.  She endorses fetal movement.  She reports some vaginal bleeding, with wiping, prior to arrival and discharge that is "milky color."  She denies odor or irritation.     OB History     Gravida  3   Para  1   Term  1   Preterm      AB  1   Living  1      SAB  1   IAB      Ectopic      Multiple  0   Live Births  1           Past Medical History:  Diagnosis Date   Anxiety    Depression    doing ok now   Gestational diabetes 11/08/2020   Gestational hypertension 11/09/2020   Headache    SAB (spontaneous abortion) 12/07/2021    Past Surgical History:  Procedure Laterality Date   NO PAST SURGERIES      Family History  Problem Relation Age of Onset   Healthy Mother    Healthy Father    Hypertension Maternal Grandmother     Social History   Tobacco Use   Smoking status: Never   Smokeless tobacco: Never  Vaping Use   Vaping Use: Never used  Substance Use Topics   Alcohol use: Not Currently    Alcohol/week: 1.0 standard drink of alcohol    Types: 1 Glasses of wine per week   Drug use: Yes    Types: Marijuana    Allergies: No Known Allergies  Medications Prior to Admission  Medication Sig Dispense Refill Last Dose   Prenatal MV & Min w/FA-DHA (PRENATAL ADULT GUMMY/DHA/FA) 0.4-25 MG CHEW Chew 1 Dose by mouth daily. 30 tablet 11     Review of  Systems  Gastrointestinal:  Positive for abdominal pain (Intermittent, None currently) and nausea (None currently). Negative for constipation, diarrhea and vomiting.  Genitourinary:  Positive for pelvic pain, vaginal bleeding and vaginal discharge. Negative for difficulty urinating and dysuria.  Neurological:  Negative for dizziness, light-headedness and headaches.   Physical Exam   Blood pressure 134/66, pulse 83, temperature 99 F (37.2 C), resp. rate 16, last menstrual period 09/28/2021, SpO2 100 %, unknown if currently breastfeeding.  Physical Exam Vitals reviewed. Exam conducted with a chaperone present.  Constitutional:      Appearance: Normal appearance.  HENT:     Head: Normocephalic and atraumatic.  Eyes:     Conjunctiva/sclera: Conjunctivae normal.  Cardiovascular:     Rate and Rhythm: Normal rate and regular rhythm.  Pulmonary:     Effort: Pulmonary effort is normal. No respiratory distress.  Abdominal:     General: There is no distension.  Genitourinary:    Comments: Speculum Exam: -Normal External Genitalia: Non tender, no apparent discharge at introitus.  -  Vaginal Vault: Pink mucosa with good rugae. Moderate amt gray frothy discharge -wet prep collected -Cervix:Pink, no lesions, cysts, or polyps.  Appears closed. No active bleeding from os-GC/CT collected -Bimanual Exam: Dilation: Closed Exam by:: Gerrit Heck, CNM Musculoskeletal:        General: Normal range of motion.     Cervical back: Normal range of motion.  Skin:    General: Skin is warm and dry.  Neurological:     Mental Status: She is alert and oriented to person, place, and time.  Psychiatric:        Mood and Affect: Mood normal.        Behavior: Behavior normal.     Fetal Assessment 145 bpm, Mod Var, -Decels, +10x10 Accels Toco: No ctx graphed  MAU Course   Results for orders placed or performed during the hospital encounter of 06/01/22 (from the past 24 hour(s))  Urinalysis, Routine w  reflex microscopic Urine, Clean Catch     Status: Abnormal   Collection Time: 06/01/22  9:10 PM  Result Value Ref Range   Color, Urine YELLOW YELLOW   APPearance CLEAR CLEAR   Specific Gravity, Urine 1.018 1.005 - 1.030   pH 6.0 5.0 - 8.0   Glucose, UA NEGATIVE NEGATIVE mg/dL   Hgb urine dipstick SMALL (A) NEGATIVE   Bilirubin Urine NEGATIVE NEGATIVE   Ketones, ur NEGATIVE NEGATIVE mg/dL   Protein, ur NEGATIVE NEGATIVE mg/dL   Nitrite NEGATIVE NEGATIVE   Leukocytes,Ua LARGE (A) NEGATIVE   RBC / HPF 0-5 0 - 5 RBC/hpf   WBC, UA 0-5 0 - 5 WBC/hpf   Bacteria, UA NONE SEEN NONE SEEN   Squamous Epithelial / LPF 6-10 0 - 5  Wet prep, genital     Status: Abnormal   Collection Time: 06/01/22  9:24 PM  Result Value Ref Range   Yeast Wet Prep HPF POC NONE SEEN NONE SEEN   Trich, Wet Prep NONE SEEN NONE SEEN   Clue Cells Wet Prep HPF POC PRESENT (A) NONE SEEN   WBC, Wet Prep HPF POC >=10 (A) <10   Sperm NONE SEEN    No results found.  MDM PE Labs: UA, Wet Prep, GC/CT EFM Prescription Assessment and Plan  21 year old G3P1011  SIUP at 24.6 weeks Cat I FT Pelvic Pain  -POC Reviewed. -Exam performed and findings discussed. -Informed that findings suggestive of BV. -Reviewed treatment. -Cultures collected. -Patient offered and declines pain medication. -Patient agreeable to heating pad. -Monitor and reassess. -NST reactive. Okay to discontinue.   Cherre Robins MSN, CNM 06/01/2022, 9:15 PM   Reassessment (10:09 PM)  -Results as above. -Rx for metronidazole gel sent. -Patient to follow up as scheduled. -Precautions to be reviewed by nurse. -Encouraged to call primary office or return to MAU if symptoms worsen or with the onset of new symptoms. -Discharged to home in stable condition.  Cherre Robins MSN, CNM Advanced Practice Provider, [facility] [employer] [facility]

## 2022-06-01 NOTE — Telephone Encounter (Signed)
Called patient and reviewed upcoming centering appt information. Patient verbalized understanding and states she will be there.

## 2022-06-01 NOTE — MAU Note (Signed)
.  Paula Nunez is a 21 y.o. at [redacted]w[redacted]d here in MAU reporting: Pt reports she was at work today and started having pelvic pain 1430. Pt reports her cervix felt like it was opening.  Pt reports she wiped and saw red blood on the tissue.   Onset of complaint: today 1430 Pain score: pelvic pain 5/10 There were no vitals filed for this visit.   Lab orders placed from triage:   ua

## 2022-06-06 ENCOUNTER — Encounter: Payer: Self-pay | Admitting: General Practice

## 2022-06-06 ENCOUNTER — Ambulatory Visit (INDEPENDENT_AMBULATORY_CARE_PROVIDER_SITE_OTHER): Payer: Medicaid Other | Admitting: Women's Health

## 2022-06-06 VITALS — BP 119/63 | HR 82 | Wt 169.0 lb

## 2022-06-06 DIAGNOSIS — D563 Thalassemia minor: Secondary | ICD-10-CM

## 2022-06-06 DIAGNOSIS — O09299 Supervision of pregnancy with other poor reproductive or obstetric history, unspecified trimester: Secondary | ICD-10-CM

## 2022-06-06 DIAGNOSIS — Z8759 Personal history of other complications of pregnancy, childbirth and the puerperium: Secondary | ICD-10-CM

## 2022-06-06 DIAGNOSIS — Z8632 Personal history of gestational diabetes: Secondary | ICD-10-CM

## 2022-06-06 DIAGNOSIS — Z3491 Encounter for supervision of normal pregnancy, unspecified, first trimester: Secondary | ICD-10-CM

## 2022-06-06 DIAGNOSIS — O2342 Unspecified infection of urinary tract in pregnancy, second trimester: Secondary | ICD-10-CM

## 2022-06-06 DIAGNOSIS — Z3A25 25 weeks gestation of pregnancy: Secondary | ICD-10-CM

## 2022-06-06 DIAGNOSIS — O98112 Syphilis complicating pregnancy, second trimester: Secondary | ICD-10-CM

## 2022-06-06 LAB — GC/CHLAMYDIA PROBE AMP (~~LOC~~) NOT AT ARMC
Chlamydia: NEGATIVE
Comment: NEGATIVE
Comment: NORMAL
Neisseria Gonorrhea: NEGATIVE

## 2022-06-06 NOTE — Patient Instructions (Signed)

## 2022-06-06 NOTE — Progress Notes (Signed)
Subjective:  Paula Nunez is a 21 y.o. G3P1011 at [redacted]w[redacted]d being seen today for ongoing prenatal care.  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  Patient reports no complaints.  Contractions: Not present. Vag. Bleeding: None.  Movement: Present. Denies leaking of fluid.   The following portions of the patient's history were reviewed and updated as appropriate: allergies, current medications, past family history, past medical history, past social history, past surgical history and problem list. Problem list updated.  Objective:   Vitals:   06/06/22 0911  BP: 119/63  Pulse: 82  Weight: 169 lb (76.7 kg)    Fetal Status: Fetal Heart Rate (bpm): 140 Fundal Height: 27 cm Movement: Present     General:  Alert, oriented and cooperative. Patient is in no acute distress.  Skin: Skin is warm and dry. No rash noted.   Cardiovascular: Normal heart rate noted  Respiratory: Normal respiratory effort, no problems with respiration noted  Abdomen: Soft, gravid, appropriate for gestational age. Pain/Pressure: Absent     Pelvic: Vag. Bleeding: None     Cervical exam deferred        Extremities: Normal range of motion.  Edema: None  Mental Status: Normal mood and affect. Normal behavior. Normal judgment and thought content.   Urinalysis:      Assessment and Plan:  Pregnancy: G3P1011 at [redacted]w[redacted]d  1. Supervision of low-risk pregnancy, first trimester -episode of dizziness/near syncope 09/02, pt to let us know if it happens again for referral to OB cardio  2. 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 UTI (urinary tract infection) during pregnancy, second trimester -TOC neg  4. History of gestational hypertension -BP today 119/63  5. History of gestational diabetes mellitus (GDM) in prior pregnancy, currently pregnant -early A1C 04/11/2022 5.2  6. Alpha thalassemia silent carrier -GC ordered  7. [redacted] weeks gestation of pregnancy  Preterm labor symptoms and general obstetric precautions including but not limited to vaginal bleeding, contractions, leaking of fluid and fetal movement were reviewed in detail with the patient. I discussed the assessment and treatment plan with the patient. The patient was provided an opportunity to ask questions and all were answered. The patient agreed with the plan and demonstrated an understanding of the instructions. The patient was advised to call back or seek an in-person office evaluation/go to MAU at [facility] for any urgent or concerning symptoms. Please refer to After Visit Summary for other counseling recommendations.  Return in about 2 weeks (around 06/20/2022) for Centering, needs GTT.   Ilina Xu, Odie Sera, NP

## 2022-06-07 ENCOUNTER — Encounter: Payer: Self-pay | Admitting: *Deleted

## 2022-06-14 ENCOUNTER — Other Ambulatory Visit: Payer: Self-pay

## 2022-06-14 DIAGNOSIS — Z3491 Encounter for supervision of normal pregnancy, unspecified, first trimester: Secondary | ICD-10-CM

## 2022-06-15 ENCOUNTER — Other Ambulatory Visit: Payer: Medicaid Other

## 2022-06-15 ENCOUNTER — Other Ambulatory Visit: Payer: Self-pay

## 2022-06-15 DIAGNOSIS — Z3491 Encounter for supervision of normal pregnancy, unspecified, first trimester: Secondary | ICD-10-CM | POA: Diagnosis not present

## 2022-06-16 ENCOUNTER — Ambulatory Visit: Payer: Medicaid Other | Attending: Obstetrics and Gynecology

## 2022-06-16 ENCOUNTER — Telehealth: Payer: Self-pay | Admitting: Family Medicine

## 2022-06-16 ENCOUNTER — Other Ambulatory Visit: Payer: Self-pay | Admitting: *Deleted

## 2022-06-16 ENCOUNTER — Ambulatory Visit: Payer: Medicaid Other | Admitting: *Deleted

## 2022-06-16 VITALS — BP 138/60 | HR 93

## 2022-06-16 DIAGNOSIS — O98112 Syphilis complicating pregnancy, second trimester: Secondary | ICD-10-CM

## 2022-06-16 DIAGNOSIS — Z3491 Encounter for supervision of normal pregnancy, unspecified, first trimester: Secondary | ICD-10-CM | POA: Insufficient documentation

## 2022-06-16 DIAGNOSIS — O321XX0 Maternal care for breech presentation, not applicable or unspecified: Secondary | ICD-10-CM | POA: Insufficient documentation

## 2022-06-16 DIAGNOSIS — Z8759 Personal history of other complications of pregnancy, childbirth and the puerperium: Secondary | ICD-10-CM | POA: Diagnosis not present

## 2022-06-16 DIAGNOSIS — O1492 Unspecified pre-eclampsia, second trimester: Secondary | ICD-10-CM | POA: Insufficient documentation

## 2022-06-16 DIAGNOSIS — O09299 Supervision of pregnancy with other poor reproductive or obstetric history, unspecified trimester: Secondary | ICD-10-CM

## 2022-06-16 DIAGNOSIS — O99012 Anemia complicating pregnancy, second trimester: Secondary | ICD-10-CM

## 2022-06-16 DIAGNOSIS — O09292 Supervision of pregnancy with other poor reproductive or obstetric history, second trimester: Secondary | ICD-10-CM | POA: Insufficient documentation

## 2022-06-16 DIAGNOSIS — Z8632 Personal history of gestational diabetes: Secondary | ICD-10-CM | POA: Diagnosis not present

## 2022-06-16 DIAGNOSIS — O280 Abnormal hematological finding on antenatal screening of mother: Secondary | ICD-10-CM | POA: Diagnosis not present

## 2022-06-16 DIAGNOSIS — O24112 Pre-existing diabetes mellitus, type 2, in pregnancy, second trimester: Secondary | ICD-10-CM | POA: Diagnosis not present

## 2022-06-16 DIAGNOSIS — Z3A27 27 weeks gestation of pregnancy: Secondary | ICD-10-CM | POA: Insufficient documentation

## 2022-06-16 DIAGNOSIS — Z362 Encounter for other antenatal screening follow-up: Secondary | ICD-10-CM | POA: Insufficient documentation

## 2022-06-16 LAB — CBC
Hematocrit: 28.4 % — ABNORMAL LOW (ref 34.0–46.6)
Hemoglobin: 9.3 g/dL — ABNORMAL LOW (ref 11.1–15.9)
MCH: 27 pg (ref 26.6–33.0)
MCHC: 32.7 g/dL (ref 31.5–35.7)
MCV: 82 fL (ref 79–97)
Platelets: 233 10*3/uL (ref 150–450)
RBC: 3.45 x10E6/uL — ABNORMAL LOW (ref 3.77–5.28)
RDW: 12.9 % (ref 11.7–15.4)
WBC: 6.5 10*3/uL (ref 3.4–10.8)

## 2022-06-16 LAB — RPR, QUANT+TP ABS (REFLEX)
Rapid Plasma Reagin, Quant: 1:8 {titer} — ABNORMAL HIGH
T Pallidum Abs: REACTIVE — AB

## 2022-06-16 LAB — GLUCOSE TOLERANCE, 2 HOURS W/ 1HR
Glucose, 1 hour: 125 mg/dL (ref 70–179)
Glucose, 2 hour: 74 mg/dL (ref 70–152)
Glucose, Fasting: 86 mg/dL (ref 70–91)

## 2022-06-16 LAB — HIV ANTIBODY (ROUTINE TESTING W REFLEX): HIV Screen 4th Generation wRfx: NONREACTIVE

## 2022-06-16 LAB — RPR: RPR Ser Ql: REACTIVE — AB

## 2022-06-16 NOTE — Telephone Encounter (Signed)
Called pt at phone number on file. No answer. Left VM.

## 2022-06-16 NOTE — Telephone Encounter (Signed)
Called pt; VM left. Encouraged pt to call back or send MyChart message.

## 2022-06-16 NOTE — Telephone Encounter (Signed)
Patient would like to speak to a nurse about a question she has regarding her results.

## 2022-06-21 ENCOUNTER — Ambulatory Visit (INDEPENDENT_AMBULATORY_CARE_PROVIDER_SITE_OTHER): Payer: Medicaid Other | Admitting: Advanced Practice Midwife

## 2022-06-21 ENCOUNTER — Other Ambulatory Visit (HOSPITAL_COMMUNITY)
Admission: RE | Admit: 2022-06-21 | Discharge: 2022-06-21 | Disposition: A | Discharge status: Home / Self Care | Payer: Medicaid Other | Source: Ambulatory Visit | Attending: Advanced Practice Midwife | Admitting: Advanced Practice Midwife

## 2022-06-21 VITALS — BP 118/76 | HR 101 | Wt 175.0 lb

## 2022-06-21 DIAGNOSIS — O26892 Other specified pregnancy related conditions, second trimester: Secondary | ICD-10-CM | POA: Insufficient documentation

## 2022-06-21 DIAGNOSIS — N898 Other specified noninflammatory disorders of vagina: Secondary | ICD-10-CM | POA: Insufficient documentation

## 2022-06-21 DIAGNOSIS — O26899 Other specified pregnancy related conditions, unspecified trimester: Secondary | ICD-10-CM

## 2022-06-21 DIAGNOSIS — Z3A27 27 weeks gestation of pregnancy: Secondary | ICD-10-CM

## 2022-06-21 DIAGNOSIS — R102 Pelvic and perineal pain: Secondary | ICD-10-CM | POA: Diagnosis not present

## 2022-06-21 DIAGNOSIS — B9689 Other specified bacterial agents as the cause of diseases classified elsewhere: Secondary | ICD-10-CM

## 2022-06-21 DIAGNOSIS — Z8759 Personal history of other complications of pregnancy, childbirth and the puerperium: Secondary | ICD-10-CM

## 2022-06-21 DIAGNOSIS — Z3491 Encounter for supervision of normal pregnancy, unspecified, first trimester: Secondary | ICD-10-CM

## 2022-06-21 DIAGNOSIS — D563 Thalassemia minor: Secondary | ICD-10-CM

## 2022-06-21 DIAGNOSIS — N760 Acute vaginitis: Secondary | ICD-10-CM

## 2022-06-21 DIAGNOSIS — O09299 Supervision of pregnancy with other poor reproductive or obstetric history, unspecified trimester: Secondary | ICD-10-CM

## 2022-06-21 DIAGNOSIS — O98112 Syphilis complicating pregnancy, second trimester: Secondary | ICD-10-CM

## 2022-06-21 DIAGNOSIS — O99012 Anemia complicating pregnancy, second trimester: Secondary | ICD-10-CM

## 2022-06-21 DIAGNOSIS — B3731 Acute candidiasis of vulva and vagina: Secondary | ICD-10-CM

## 2022-06-21 DIAGNOSIS — Z8632 Personal history of gestational diabetes: Secondary | ICD-10-CM

## 2022-06-21 DIAGNOSIS — Z3490 Encounter for supervision of normal pregnancy, unspecified, unspecified trimester: Secondary | ICD-10-CM

## 2022-06-21 NOTE — Patient Instructions (Signed)

## 2022-06-22 LAB — CERVICOVAGINAL ANCILLARY ONLY
Bacterial Vaginitis (gardnerella): POSITIVE — AB
Candida Glabrata: NEGATIVE
Candida Vaginitis: POSITIVE — AB
Chlamydia: NEGATIVE
Comment: NEGATIVE
Comment: NEGATIVE
Comment: NEGATIVE
Comment: NEGATIVE
Comment: NEGATIVE
Comment: NORMAL
Neisseria Gonorrhea: NEGATIVE
Trichomonas: NEGATIVE

## 2022-06-22 LAB — FETAL FIBRONECTIN: Fetal Fibronectin: NEGATIVE

## 2022-06-22 MED ORDER — FERROUS SULFATE 325 (65 FE) MG PO TABS: 325.0000 mg | ORAL_TABLET | ORAL | 1 refills | Status: DC

## 2022-06-22 NOTE — Addendum Note (Signed)
Addended by: Manya Silvas on: 06/22/2022 04:46 PM   Modules accepted: Orders

## 2022-06-22 NOTE — Addendum Note (Signed)
Addended by: Manya Silvas on: 06/22/2022 04:50 PM   Modules accepted: Orders

## 2022-06-22 NOTE — Progress Notes (Addendum)
PRENATAL VISIT NOTE  Subjective:  Paula Nunez is a 21 y.o. G3P1011 at 51w5dbeing seen today for ongoing prenatal care.  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  Patient reports occasional contractions and vaginal discharge. Contractions q 10 minutes last night .  Contractions: Irritability. Vag. Bleeding: None.  Movement: Present. Denies leaking of fluid. Concerned about effects of Syphilis on baby. Has completed all 3 PCN doses.   The following portions of the patient's history were reviewed and updated as appropriate: allergies, current medications, past family history, past medical history, past social history, past surgical history and problem list.   Objective:   Vitals:   06/21/22 1342  BP: 118/76  Pulse: (!) 101  Weight: 175 lb (79.4 kg)    Fetal Status: Fetal Heart Rate (bpm): 140 Fundal Height: 30 cm Movement: Present     General:  Alert, oriented and cooperative. Patient is in no acute distress.  Skin: Skin is warm and dry. No rash noted.   Cardiovascular: Normal heart rate noted  Respiratory: Normal respiratory effort, no problems with respiration noted  Abdomen: Soft, gravid, appropriate for gestational age.  Pain/Pressure: Present     Pelvic: Cervical exam performed in the presence of a chaperone Dilation: Fingertip Effacement (%): 0 Station: Ballotable. Mod amount of this, what malodorous discharge. Aptima collected.   Extremities: Normal range of motion.  Edema: None  Mental Status: Normal mood and affect. Normal behavior. Normal judgment and thought content.    Assessment and Plan:  Pregnancy: G3P1011 at 255w6d. Supervision of low-risk pregnancy, first trimester - ROB 2 weeks, then Centering as scheduled  3. Vaginal discharge during pregnancy in second trimester  - Cervicovaginal ancillary only  4. Pelvic pain during pregnancy - Cervix slightly open external os, but C/W multiparity  - Cervicovaginal ancillary only - Fetal fibronectin  5. 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 Will schedule 1-on-1 visit it two weeks to do labs and discuss in more detail.   6. History of gestational hypertension - Nml BP - On ASA  7. History of gestational diabetes mellitus (GDM) in prior pregnancy, currently pregnant - Nml GTT  8. Alpha thalassemia silent carrier - Partner test kit given  9. [redacted] weeks gestation of pregnancy      Centering Pregnancy, Session 3/5 (Newly combined): Reviewed resources in [employer]   Facilitated discussion today:  - "The Family I Want to Have" traditions/parenting practices to continue and discontinue - Reach Out and Read: Pediatric literacy, reading as a means for bonding with baby - Parenting role discussion - Preterm labor vs normal discomforts of pregnancy - Flu vaccine recommended - TDaP vaccine recommended   Fundal height and FHR appropriate today unless noted otherwise in plan. Patient to continue group care.   Preterm labor symptoms and general obstetric precautions including but not limited to vaginal bleeding, contractions, leaking of fluid and fetal movement were reviewed in detail with the patient. Please refer to After Visit Summary for other counseling recommendations.   Return in about 2 weeks (around 07/05/2022) for 2 week ROB (PKennon Roundsr NeErnestina Patchesf possible).  Future Appointments  Date Time Provider [place]  07/06/2022  1:15 PM Caren Macadam, MD [facility]  07/19/2022  9:00  AM CENTERING PROVIDER [facility]  07/28/2022  2:30 PM [facility] NURSE [facility]  07/28/2022  2:45 PM [facility] US5 [facility]  08/02/2022  9:00 AM CENTERING PROVIDER [facility]  08/16/2022  9:00 AM CENTERING PROVIDER [facility]  08/30/2022  9:00 AM CENTERING PROVIDER [facility]  09/13/2022  9:00 AM CENTERING PROVIDER [facility], CNM

## 2022-06-23 MED ORDER — METRONIDAZOLE 500 MG PO TABS: 500.0000 mg | ORAL_TABLET | Freq: Two times a day (BID) | ORAL | 0 refills | Status: DC

## 2022-06-23 MED ORDER — TERCONAZOLE 0.4 % VA CREA: 1.0000 | TOPICAL_CREAM | Freq: Every day | VAGINAL | 1 refills | Status: DC

## 2022-06-23 NOTE — Addendum Note (Signed)
Addended by: Manya Silvas on: 06/23/2022 02:38 PM   Modules accepted: Orders

## 2022-07-06 ENCOUNTER — Encounter: Payer: Medicaid Other | Admitting: Family Medicine

## 2022-07-07 ENCOUNTER — Encounter: Payer: Self-pay | Admitting: Women's Health

## 2022-07-13 ENCOUNTER — Encounter: Payer: Self-pay | Admitting: *Deleted

## 2022-07-19 ENCOUNTER — Ambulatory Visit (INDEPENDENT_AMBULATORY_CARE_PROVIDER_SITE_OTHER): Payer: Medicaid Other | Admitting: Family Medicine

## 2022-07-19 ENCOUNTER — Encounter: Payer: Self-pay | Admitting: Family Medicine

## 2022-07-19 VITALS — BP 124/75 | HR 87 | Wt 186.0 lb

## 2022-07-19 DIAGNOSIS — Z8632 Personal history of gestational diabetes: Secondary | ICD-10-CM

## 2022-07-19 DIAGNOSIS — Z23 Encounter for immunization: Secondary | ICD-10-CM

## 2022-07-19 DIAGNOSIS — O99013 Anemia complicating pregnancy, third trimester: Secondary | ICD-10-CM

## 2022-07-19 DIAGNOSIS — O09299 Supervision of pregnancy with other poor reproductive or obstetric history, unspecified trimester: Secondary | ICD-10-CM

## 2022-07-19 DIAGNOSIS — Z8759 Personal history of other complications of pregnancy, childbirth and the puerperium: Secondary | ICD-10-CM

## 2022-07-19 DIAGNOSIS — Z3491 Encounter for supervision of normal pregnancy, unspecified, first trimester: Secondary | ICD-10-CM

## 2022-07-19 DIAGNOSIS — O98112 Syphilis complicating pregnancy, second trimester: Secondary | ICD-10-CM

## 2022-07-19 MED ORDER — TETANUS-DIPHTH-ACELL PERTUSSIS 5-2.5-18.5 LF-MCG/0.5 IM SUSY
0.5000 mL | PREFILLED_SYRINGE | Freq: Once | INTRAMUSCULAR | Status: AC
  Administered 2022-07-19: 0.5 mL via INTRAMUSCULAR

## 2022-07-19 NOTE — Progress Notes (Signed)
tda 

## 2022-07-19 NOTE — Progress Notes (Signed)
   PRENATAL VISIT NOTE  Subjective:  Paula Nunez is a 21 y.o. G3P1011 at [redacted]w[redacted]d being seen today for ongoing prenatal care.  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  Patient reports occasional contractions- last night ahd ctx 11-4am that were every 10-15 min and strong. She was breathing through them. They resolved and are not present today.   Contractions: Irregular. Vag. Bleeding: None.  Movement: Present. Denies leaking of fluid.   The following portions of the patient's history were reviewed and updated as appropriate: allergies, current medications, past family history, past medical history, past social history, past surgical history and problem list.   Objective:   Vitals:   07/19/22 1454  BP: 124/75  Pulse: 87  Weight: 186 lb (84.4 kg)    Fetal Status: Fetal Heart Rate (bpm): 141 Fundal Height: 32 cm Movement: Present  Presentation: Vertex  General:  Alert, oriented and cooperative. Patient is in no acute distress.  Skin: Skin is warm and dry. No rash noted.   Cardiovascular: Normal heart rate noted  Respiratory: Normal respiratory effort, no problems with respiration noted  Abdomen: Soft, gravid, appropriate for gestational age.  Pain/Pressure: Absent     Pelvic: Cervical exam deferred        Extremities: Normal range of motion.  Edema: None  Mental Status: Normal mood and affect. Normal behavior. Normal judgment and thought content.   Assessment and Plan:  Pregnancy: G3P1011 at [redacted]w[redacted]d 1. Need for vaccination - Tdap (BOOSTRIX)  injection 0.5 mL  2. Supervision of low-risk pregnancy, first trimester  Centering Pregnancy, Session#4: Reviewed resources in [employer].   Facilitated discussion today: - Breastfeeding popcorn activity - Nursing pain - Pump acquisition-- given edgepark information - ROR Given "Knees and Toes" and discussed importance of reading for brain development and language.   Fundal height and FHR appropriate today unless noted otherwise in plan. Patient to continue group care.   - Tdap (BOOSTRIX) injection 0.5 mL  3. Anemia affecting pregnancy in third trimester Late hgb 9.3, was not taking iron Recommended iron and realized severity of anemia after patient left. Will sent mychart and recommend blood builder.  Recheck HGB at next centering  4. History of gestational diabetes mellitus (GDM) in prior pregnancy, currently pregnant No GDM this pregnancy 2hr WNL  5. History of gestational hypertension BP WNL today  6. 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 Titer dropped from 1:32 --> 1:8   Preterm labor symptoms and general obstetric precautions including but not limited to vaginal bleeding, contractions, leaking of fluid and fetal movement were reviewed in detail with the patient. Please refer to After Visit Summary for other counseling recommendations.   Return in about 2 weeks (around 08/02/2022) for Centering.  Future Appointments  Date Time Provider [place]  07/28/2022  2:30 PM [facility] NURSE [facility]  07/28/2022  2:45 PM [facility] US5 [facility]  08/02/2022  9:00 AM CENTERING PROVIDER [facility]  08/16/2022  9:00 AM CENTERING PROVIDER [facility]  08/30/2022  9:00 AM CENTERING PROVIDER [facility]  09/13/2022  9:00 AM CENTERING PROVIDER [facility]    Caren Macadam, MD

## 2022-07-21 ENCOUNTER — Other Ambulatory Visit: Payer: Self-pay

## 2022-07-21 NOTE — Addendum Note (Signed)
Addended by: Caryl Bis on: 07/21/2022 10:05 AM   Modules accepted: Orders

## 2022-07-28 ENCOUNTER — Other Ambulatory Visit: Payer: Self-pay | Admitting: *Deleted

## 2022-07-28 ENCOUNTER — Ambulatory Visit: Payer: Medicaid Other | Attending: Obstetrics

## 2022-07-28 ENCOUNTER — Ambulatory Visit: Payer: Medicaid Other | Admitting: *Deleted

## 2022-07-28 VITALS — BP 124/49 | HR 103

## 2022-07-28 DIAGNOSIS — Z362 Encounter for other antenatal screening follow-up: Secondary | ICD-10-CM | POA: Insufficient documentation

## 2022-07-28 DIAGNOSIS — Z148 Genetic carrier of other disease: Secondary | ICD-10-CM | POA: Insufficient documentation

## 2022-07-28 DIAGNOSIS — O09293 Supervision of pregnancy with other poor reproductive or obstetric history, third trimester: Secondary | ICD-10-CM | POA: Insufficient documentation

## 2022-07-28 DIAGNOSIS — O98113 Syphilis complicating pregnancy, third trimester: Secondary | ICD-10-CM | POA: Diagnosis not present

## 2022-07-28 DIAGNOSIS — O285 Abnormal chromosomal and genetic finding on antenatal screening of mother: Secondary | ICD-10-CM | POA: Diagnosis not present

## 2022-07-28 DIAGNOSIS — O98112 Syphilis complicating pregnancy, second trimester: Secondary | ICD-10-CM

## 2022-07-28 DIAGNOSIS — Z8759 Personal history of other complications of pregnancy, childbirth and the puerperium: Secondary | ICD-10-CM

## 2022-07-28 DIAGNOSIS — O09299 Supervision of pregnancy with other poor reproductive or obstetric history, unspecified trimester: Secondary | ICD-10-CM

## 2022-07-28 DIAGNOSIS — Z3A33 33 weeks gestation of pregnancy: Secondary | ICD-10-CM | POA: Insufficient documentation

## 2022-07-28 DIAGNOSIS — A539 Syphilis, unspecified: Secondary | ICD-10-CM

## 2022-07-28 DIAGNOSIS — D563 Thalassemia minor: Secondary | ICD-10-CM | POA: Diagnosis not present

## 2022-07-28 DIAGNOSIS — O99013 Anemia complicating pregnancy, third trimester: Secondary | ICD-10-CM

## 2022-07-28 DIAGNOSIS — Z3491 Encounter for supervision of normal pregnancy, unspecified, first trimester: Secondary | ICD-10-CM

## 2022-07-31 ENCOUNTER — Other Ambulatory Visit: Payer: Self-pay

## 2022-07-31 ENCOUNTER — Encounter (HOSPITAL_COMMUNITY): Payer: Self-pay | Admitting: Obstetrics and Gynecology

## 2022-07-31 ENCOUNTER — Inpatient Hospital Stay (HOSPITAL_COMMUNITY)
Admission: AD | Admit: 2022-07-31 | Discharge: 2022-07-31 | Disposition: A | Discharge status: Home / Self Care | Payer: Medicaid Other | Attending: Family Medicine | Admitting: Family Medicine

## 2022-07-31 ENCOUNTER — Encounter: Payer: Self-pay | Admitting: Family Medicine

## 2022-07-31 DIAGNOSIS — O2313 Infections of bladder in pregnancy, third trimester: Secondary | ICD-10-CM | POA: Diagnosis not present

## 2022-07-31 DIAGNOSIS — N3000 Acute cystitis without hematuria: Secondary | ICD-10-CM | POA: Diagnosis not present

## 2022-07-31 DIAGNOSIS — O4703 False labor before 37 completed weeks of gestation, third trimester: Secondary | ICD-10-CM | POA: Diagnosis not present

## 2022-07-31 DIAGNOSIS — B9689 Other specified bacterial agents as the cause of diseases classified elsewhere: Secondary | ICD-10-CM | POA: Insufficient documentation

## 2022-07-31 DIAGNOSIS — Z3A33 33 weeks gestation of pregnancy: Secondary | ICD-10-CM | POA: Diagnosis not present

## 2022-07-31 DIAGNOSIS — O23593 Infection of other part of genital tract in pregnancy, third trimester: Secondary | ICD-10-CM | POA: Diagnosis not present

## 2022-07-31 HISTORY — DX: Anemia, unspecified: D64.9

## 2022-07-31 HISTORY — DX: Gestational (pregnancy-induced) hypertension without significant proteinuria, unspecified trimester: O13.9

## 2022-07-31 LAB — URINALYSIS, ROUTINE W REFLEX MICROSCOPIC
Bilirubin Urine: NEGATIVE
Glucose, UA: NEGATIVE mg/dL
Hgb urine dipstick: NEGATIVE
Ketones, ur: 80 mg/dL — AB
Nitrite: NEGATIVE
Protein, ur: 30 mg/dL — AB
Specific Gravity, Urine: 1.026 (ref 1.005–1.030)
pH: 6 (ref 5.0–8.0)

## 2022-07-31 LAB — WET PREP, GENITAL
Sperm: NONE SEEN
Trich, Wet Prep: NONE SEEN
WBC, Wet Prep HPF POC: <10 (ref <10)
Yeast Wet Prep HPF POC: NONE SEEN

## 2022-07-31 MED ORDER — ONDANSETRON HCL 4 MG/2ML IJ SOLN
4.0000 mg | Freq: Once | INTRAMUSCULAR | Status: AC
  Administered 2022-07-31: 4 mg via INTRAVENOUS
  Filled 2022-07-31: qty 2

## 2022-07-31 MED ORDER — LACTATED RINGERS IV BOLUS
1000.0000 mL | Freq: Once | INTRAVENOUS | Status: AC
Start: 2022-07-31 — End: 2022-07-31
  Administered 2022-07-31: 1000 mL via INTRAVENOUS

## 2022-07-31 MED ORDER — METRONIDAZOLE 500 MG PO TABS: 500.0000 mg | ORAL_TABLET | Freq: Three times a day (TID) | ORAL | 0 refills | Status: AC

## 2022-07-31 MED ORDER — CEFADROXIL 500 MG PO CAPS: 500.0000 mg | ORAL_CAPSULE | Freq: Two times a day (BID) | ORAL | 0 refills | Status: DC

## 2022-07-31 MED ORDER — NIFEDIPINE 10 MG PO CAPS
10.0000 mg | ORAL_CAPSULE | Freq: Once | ORAL | Status: AC
Start: 2022-07-31 — End: 2022-07-31
  Administered 2022-07-31: 10 mg via ORAL
  Filled 2022-07-31: qty 1

## 2022-07-31 MED ORDER — LACTATED RINGERS IV BOLUS
1000.0000 mL | Freq: Once | INTRAVENOUS | Status: AC
  Administered 2022-07-31: 1000 mL via INTRAVENOUS

## 2022-07-31 NOTE — MAU Note (Signed)
.  Paula Nunez is a 21 y.o. at [redacted]w[redacted]d here in MAU reporting: contractions since 0300 this morning every 5-50min increasing in frequency. Pain starts in lower back and radiates to stomach. Denies bleeding, minimal leaking of clear fluid. Reports sex 48hrs ago. Reports severe nausea, unable to eat or drink since 2230 07/30/22.  Onset of complaint: 07/30/22 Pain score: 8/10 There were no vitals filed for this visit.   FHT: 135 bpm Lab orders placed from triage: U/A

## 2022-07-31 NOTE — MAU Provider Note (Signed)
History     CSN: [id number]  Arrival date and time: 07/31/22 1644   Event Date/Time   First Provider Initiated Contact with Patient 07/31/22 1719      CC: Contractions x 5 minutes.  Ms. Goodwill is a 21 year old female G3P1011 who is [redacted]w[redacted]d who presents to MAU with contractions. The patient states that the contractions began last night and were 1-2 hours apart with associated nausea. The patient reports that this morning at 9am she had one episode of vomiting and notes that her contractions shortened to 5-10 minutes apart. She rates her pain 8/10. She continues to have nausea. Denies any vaginal bleeding. Does endorse some minimal "watery leakage" at 9 am this morning. The patient denies taking any medications for the pain.     OB History     Gravida  3   Para  1   Term  1   Preterm      AB  1   Living  1      SAB  1   IAB      Ectopic      Multiple  0   Live Births  1           Past Medical History:  Diagnosis Date   Anemia    Anxiety    Depression    doing ok now   Gestational diabetes 11/08/2020   Gestational hypertension 11/09/2020   Headache    Pregnancy induced hypertension    SAB (spontaneous abortion) 12/07/2021    Past Surgical History:  Procedure Laterality Date   NO PAST SURGERIES      Family History  Problem Relation Age of Onset   Healthy Mother    Healthy Father    Hypertension Maternal Grandmother    Diabetes Other    Asthma Neg Hx    Cancer Neg Hx    Heart disease Neg Hx     Social History   Tobacco Use   Smoking status: Never   Smokeless tobacco: Never  Vaping Use   Vaping Use: Never used  Substance Use Topics   Alcohol use: Not Currently    Alcohol/week: 1.0 standard drink of alcohol    Types: 1 Glasses of wine per week   Drug use: Yes    Types: Marijuana    Comment: Last use 07/04/22    Allergies: No Known Allergies  Medications Prior to Admission  Medication Sig Dispense Refill Last Dose   Prenatal MV &  Min w/FA-DHA (PRENATAL ADULT GUMMY/DHA/FA) 0.4-25 MG CHEW Chew 1 Dose by mouth daily. 30 tablet 11 07/30/2022 at 0900   terconazole (TERAZOL 7) 0.4 % vaginal cream Place 1 applicator vaginally at bedtime. (Patient not taking: Reported on 07/19/2022) 45 g 1     Review of Systems  Constitutional:  Negative for fever.  HENT: Negative.    Respiratory: Negative.    Cardiovascular: Negative.   Gastrointestinal:  Positive for abdominal pain, nausea and vomiting.  Genitourinary:  Positive for flank pain and pelvic pain. Negative for vaginal bleeding.   Physical Exam   Blood pressure 138/63, pulse 98, temperature 98.8 F (37.1 C), temperature source Oral, last menstrual period 09/28/2021, SpO2 100 %, unknown if currently breastfeeding.  Physical Exam Exam conducted with a chaperone present.  HENT:     Head: Atraumatic.  Eyes:     Extraocular Movements: Extraocular movements intact.  Genitourinary:    Cervix: Dilated.  Musculoskeletal:     Cervical back: Normal range of motion.  Neurological:     Mental Status: She is alert.     MAU Course  Procedures  Results for orders placed or performed during the hospital encounter of 07/31/22 (from the past 24 hour(s))  Urinalysis, Routine w reflex microscopic Urine, Clean Catch     Status: Abnormal   Collection Time: 07/31/22  5:02 PM  Result Value Ref Range   Color, Urine YELLOW YELLOW   APPearance HAZY (A) CLEAR   Specific Gravity, Urine 1.026 1.005 - 1.030   pH 6.0 5.0 - 8.0   Glucose, UA NEGATIVE NEGATIVE mg/dL   Hgb urine dipstick NEGATIVE NEGATIVE   Bilirubin Urine NEGATIVE NEGATIVE   Ketones, ur 80 (A) NEGATIVE mg/dL   Protein, ur 30 (A) NEGATIVE mg/dL   Nitrite NEGATIVE NEGATIVE   Leukocytes,Ua TRACE (A) NEGATIVE   RBC / HPF 0-5 0 - 5 RBC/hpf   WBC, UA 0-5 0 - 5 WBC/hpf   Bacteria, UA RARE (A) NONE SEEN   Squamous Epithelial / LPF 11-20 0 - 5   Mucus PRESENT   Wet prep, genital     Status: Abnormal   Collection Time:  07/31/22  5:34 PM  Result Value Ref Range   Yeast Wet Prep HPF POC NONE SEEN NONE SEEN   Trich, Wet Prep NONE SEEN NONE SEEN   Clue Cells Wet Prep HPF POC PRESENT (A) NONE SEEN   WBC, Wet Prep HPF POC <10 <10   Sperm NONE SEEN      MDM UA, Wet prep, and GC ordered today.  Patient given LR bolus 1,075mL x3, 10mg  procardia, and 4mg  zofran.   Assessment and Plan  Contractions x5 minutes  Pregnancy, [redacted]w[redacted]d Bacterial Vaginosis  UTI  -Discharge home in stable condition -Pre term labor precautions discussed -Rx for Flagyl  -Rx for Cefadroxil -Patient advised to follow-up with OBGYN -Patient may return to MAU as needed or if her condition were to change or worsen   07/31/2022, 6:00 PM   Attestation of Supervision of Student:  I confirm that I have verified the information documented in the medical student's note and that I have also personally reperformed the history, physical exam and all medical decision making activities.  I have verified that all services and findings are accurately documented in this student's note; and I agree with management and plan as outlined in the documentation. I have also made any necessary editorial changes.  Patient presents with contractions every 5 minutes since this morning. Pain about 8/10. Hasn't eaten or drank anything today. No vaginal bleeding. Had some watery discharge. No dysuria.   BP 131/67   Pulse 97   Temp 98.8 F (37.1 C) (Oral)   LMP 09/28/2021 (Exact Date)   SpO2 100%  A&Ox3, NAD Soft, nontender, nondistended.  Dilation: 1 Effacement (%): Thick Station: -3 Exam by:: Dr 11/26/2021  Cervix unchanged on recheck >1hr.  Results for orders placed or performed during the hospital encounter of 07/31/22 (from the past 24 hour(s))  Urinalysis, Routine w reflex microscopic Urine, Clean Catch     Status: Abnormal   Collection Time: 07/31/22  5:02 PM  Result Value Ref Range   Color, Urine YELLOW YELLOW   APPearance HAZY (A)  CLEAR   Specific Gravity, Urine 1.026 1.005 - 1.030   pH 6.0 5.0 - 8.0   Glucose, UA NEGATIVE NEGATIVE mg/dL   Hgb urine dipstick NEGATIVE NEGATIVE   Bilirubin Urine NEGATIVE NEGATIVE   Ketones, ur 80 (A) NEGATIVE mg/dL   Protein,  ur 30 (A) NEGATIVE mg/dL   Nitrite NEGATIVE NEGATIVE   Leukocytes,Ua TRACE (A) NEGATIVE   RBC / HPF 0-5 0 - 5 RBC/hpf   WBC, UA 0-5 0 - 5 WBC/hpf   Bacteria, UA RARE (A) NONE SEEN   Squamous Epithelial / LPF 11-20 0 - 5   Mucus PRESENT   Wet prep, genital     Status: Abnormal   Collection Time: 07/31/22  5:34 PM  Result Value Ref Range   Yeast Wet Prep HPF POC NONE SEEN NONE SEEN   Trich, Wet Prep NONE SEEN NONE SEEN   Clue Cells Wet Prep HPF POC PRESENT (A) NONE SEEN   WBC, Wet Prep HPF POC <10 <10   Sperm NONE SEEN    1. [redacted] weeks gestation of pregnancy   2. Threatened preterm labor, third trimester   3. BV (bacterial vaginosis)   4. Acute cystitis without hematuria    Improved after IVF and procardia 10mg .  Has BV and UTI. Will treat. Urine culture sent. Return precautions given.  , DO [facility], [facility] 07/31/2022 7:55 PM

## 2022-08-01 ENCOUNTER — Encounter (HOSPITAL_COMMUNITY): Payer: Medicaid Other

## 2022-08-01 ENCOUNTER — Encounter: Payer: Self-pay | Admitting: Family Medicine

## 2022-08-01 LAB — GC/CHLAMYDIA PROBE AMP (~~LOC~~) NOT AT ARMC
Chlamydia: NEGATIVE
Comment: NEGATIVE
Comment: NORMAL
Neisseria Gonorrhea: NEGATIVE

## 2022-08-02 ENCOUNTER — Telehealth: Payer: Self-pay | Admitting: *Deleted

## 2022-08-02 ENCOUNTER — Encounter: Payer: Medicaid Other | Admitting: Family Medicine

## 2022-08-02 LAB — CULTURE, OB URINE: Culture: NO GROWTH

## 2022-08-02 NOTE — Telephone Encounter (Signed)
Patient [facility] prenatal appointment  ( CenteringPregnancy) and also her Venofer infusion. I rescheduled the Venofer infusion for 08/09/22 at 0830 at [facility] infusion clinic. I called and informed her she missed her prenatal visit and she states she overslept. I reminded her this is her prenatal care and we would rather she come late vs not at all and reviewed next ob appointment. I also informed her she missed her venofer infusion and gave her new appt. She voices understanding. Nancy Fetter

## 2022-08-09 ENCOUNTER — Non-Acute Institutional Stay (HOSPITAL_COMMUNITY)
Admission: RE | Admit: 2022-08-09 | Discharge: 2022-08-09 | Disposition: A | Discharge status: Home / Self Care | Payer: Medicaid Other | Source: Ambulatory Visit | Attending: Internal Medicine | Admitting: Internal Medicine

## 2022-08-09 DIAGNOSIS — O99013 Anemia complicating pregnancy, third trimester: Secondary | ICD-10-CM | POA: Diagnosis not present

## 2022-08-09 MED ORDER — SODIUM CHLORIDE 0.9 % IV SOLN
300.0000 mg | INTRAVENOUS | Status: DC
  Administered 2022-08-09: 300 mg via INTRAVENOUS
  Filled 2022-08-09: qty 15

## 2022-08-09 MED ORDER — SODIUM CHLORIDE 0.9 % IV BOLUS: 500.0000 mL | Freq: Once | INTRAVENOUS | Status: DC | PRN

## 2022-08-09 MED ORDER — DIPHENHYDRAMINE HCL 50 MG/ML IJ SOLN: 25.0000 mg | Freq: Once | INTRAMUSCULAR | Status: DC | PRN

## 2022-08-09 MED ORDER — METHYLPREDNISOLONE SODIUM SUCC 125 MG IJ SOLR: 125.0000 mg | Freq: Once | INTRAMUSCULAR | Status: DC | PRN

## 2022-08-09 MED ORDER — EPINEPHRINE PF 1 MG/ML IJ SOLN: 0.3000 mg | Freq: Once | INTRAMUSCULAR | Status: DC | PRN

## 2022-08-09 MED ORDER — SODIUM CHLORIDE 0.9 % IV SOLN: INTRAVENOUS | Status: DC | PRN

## 2022-08-09 MED ORDER — ALBUTEROL SULFATE (2.5 MG/3ML) 0.083% IN NEBU: 2.5000 mg | INHALATION_SOLUTION | Freq: Once | RESPIRATORY_TRACT | Status: DC | PRN

## 2022-08-09 NOTE — Progress Notes (Signed)
PATIENT CARE CENTER NOTE  Diagnosis: Anemia affecting pregnancy in third trimester [O99.013]    Provider: Lyndel Safe MD  Procedure: Venofer 300 mg  Note: Patient received Venofer 300 mg infusion (dose #1 of 2) via PIV. Tolerated infusion well with no adverse reaction. Vital signs stable. Discharge instructions given. Patient declined to stay for the 1 hour post infusion observation. Patient advised to schedule next appointment at front desk. Alert, oriented and ambulatory at discharge.

## 2022-08-14 ENCOUNTER — Non-Acute Institutional Stay (HOSPITAL_COMMUNITY)
Admission: RE | Admit: 2022-08-14 | Discharge: 2022-08-14 | Disposition: A | Discharge status: Home / Self Care | Payer: Medicaid Other | Source: Ambulatory Visit | Attending: Internal Medicine | Admitting: Internal Medicine

## 2022-08-14 ENCOUNTER — Encounter: Payer: Self-pay | Admitting: Family Medicine

## 2022-08-14 DIAGNOSIS — Z3A00 Weeks of gestation of pregnancy not specified: Secondary | ICD-10-CM | POA: Diagnosis not present

## 2022-08-14 DIAGNOSIS — O99013 Anemia complicating pregnancy, third trimester: Secondary | ICD-10-CM | POA: Insufficient documentation

## 2022-08-14 DIAGNOSIS — D649 Anemia, unspecified: Secondary | ICD-10-CM | POA: Diagnosis not present

## 2022-08-14 MED ORDER — SODIUM CHLORIDE 0.9 % IV SOLN
300.0000 mg | Freq: Once | INTRAVENOUS | Status: AC
  Administered 2022-08-14: 300 mg via INTRAVENOUS
  Filled 2022-08-14: qty 15

## 2022-08-14 MED ORDER — SODIUM CHLORIDE 0.9 % IV SOLN: INTRAVENOUS | Status: DC | PRN

## 2022-08-14 NOTE — Progress Notes (Signed)
PATIENT CARE CENTER NOTE  Diagnosis: Anemia affecting pregnancy in third trimester [O99.013]     Provider: Lyndel Safe MD  Procedure: Venofer 300 mg  Note: Patient received Venofer infusion (dose # 2 of 2) via PIV. Tolerated infusion well with no adverse reaction. Vital signs stable. Pt declined AVS. Patient observed for 1 hour post infusion. Alert, oriented and ambulatory at discharge.

## 2022-08-15 NOTE — Progress Notes (Signed)
   PRENATAL VISIT NOTE  Subjective:  Paula Nunez is a 21 y.o. G3P1011 at [redacted]w[redacted]d being seen today for ongoing prenatal care.  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  Patient reports vaginal irritation and discharge. Completed Tx for BV.   Contractions: Regular. Vag. Bleeding: None.  Movement: Present. Denies leaking of fluid.   The following portions of the patient's history were reviewed and updated as appropriate: allergies, current medications, past family history, past medical history, past social history, past surgical history and problem list.   Objective:   Vitals:   08/16/22 0955  BP: (!) 125/54  Pulse: 82  Weight: 186 lb 12.8 oz (84.7 kg)    Fetal Status: Fetal Heart Rate (bpm): 140 Fundal Height: 37 cm Movement: Present  Presentation: Vertex  General:  Alert, oriented and cooperative. Patient is in no acute distress.  Skin: Skin is warm and dry. No rash noted.   Cardiovascular: Normal heart rate noted  Respiratory: Normal respiratory effort, no problems with respiration noted  Abdomen: Soft, gravid, appropriate for gestational age.  Pain/Pressure: Present     Pelvic: Cervical exam performed in the presence of a chaperone Dilation: 1.5 Effacement (%): 0 Station: -2  Extremities: Normal range of motion.     Mental Status: Normal mood and affect. Normal behavior. Normal judgment and thought content.   Assessment and Plan:  Pregnancy: G3P1011 at [redacted]w[redacted]d  2. Anemia affecting pregnancy in third trimester - Venofer x 2 - CBC  3.  Alpha thalassemia silent carrier - Uncertain Paternity  4. 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 Supervision of other high risk pregnancy, antepartum - Encouraged to attend prenatal care  6. [redacted] weeks gestation of pregnancy  - Culture, beta strep (group b only) - Cervicovaginal ancillary only( [place])  6. Iron deficiency anemia, unspecified iron deficiency anemia type - CBC    Centering Pregnancy, Session#6: Reviewed resources in [employer].  Facilitated discussion today: family planning/reproductive life planning, coping strategies   Mindfulness activity with positive affirmations   Fundal height and FHR appropriate today unless noted otherwise in plan. Patient to continue group care.    Preterm labor symptoms and general obstetric precautions including but not limited to vaginal bleeding, contractions, leaking of fluid and fetal movement were reviewed in detail with the patient. Please refer to After Visit Summary for other counseling recommendations.   Return for needs weekly ob visits for when does not have centering starting  next week .  Future Appointments  Date Time Provider Department Center  08/23/2022  8:15 AM Kathlene Cote [facility]  08/28/2022  2:30 PM [facility] NURSE [facility]  08/28/2022  2:45 PM [facility] US5 [facility]  08/30/2022  9:00 AM CENTERING PROVIDER [facility]  08/30/2022  2:55 PM Bernerd Limbo, CNM [facility]  09/13/2022  8:15 AM Warden Fillers, MD [facility]  09/13/2022  9:00 AM CENTERING PROVIDER [facility]  09/20/2022  2:35 PM Warden Fillers, MD [facility]  09/20/2022  3:15 PM [facility]    Dorathy Kinsman, CNM

## 2022-08-16 ENCOUNTER — Ambulatory Visit (INDEPENDENT_AMBULATORY_CARE_PROVIDER_SITE_OTHER): Payer: Medicaid Other | Admitting: Advanced Practice Midwife

## 2022-08-16 ENCOUNTER — Other Ambulatory Visit (HOSPITAL_COMMUNITY)
Admission: RE | Admit: 2022-08-16 | Discharge: 2022-08-16 | Disposition: A | Discharge status: Home / Self Care | Payer: Medicaid Other | Source: Ambulatory Visit | Attending: Advanced Practice Midwife | Admitting: Advanced Practice Midwife

## 2022-08-16 ENCOUNTER — Encounter: Payer: Self-pay | Admitting: *Deleted

## 2022-08-16 VITALS — BP 125/54 | HR 82 | Wt 186.8 lb

## 2022-08-16 DIAGNOSIS — O09899 Supervision of other high risk pregnancies, unspecified trimester: Secondary | ICD-10-CM

## 2022-08-16 DIAGNOSIS — B3731 Acute candidiasis of vulva and vagina: Secondary | ICD-10-CM

## 2022-08-16 DIAGNOSIS — N760 Acute vaginitis: Secondary | ICD-10-CM

## 2022-08-16 DIAGNOSIS — D509 Iron deficiency anemia, unspecified: Secondary | ICD-10-CM | POA: Diagnosis not present

## 2022-08-16 DIAGNOSIS — O99013 Anemia complicating pregnancy, third trimester: Secondary | ICD-10-CM | POA: Diagnosis not present

## 2022-08-16 DIAGNOSIS — O26893 Other specified pregnancy related conditions, third trimester: Secondary | ICD-10-CM

## 2022-08-16 DIAGNOSIS — Z3A35 35 weeks gestation of pregnancy: Secondary | ICD-10-CM | POA: Diagnosis not present

## 2022-08-16 DIAGNOSIS — O09893 Supervision of other high risk pregnancies, third trimester: Secondary | ICD-10-CM

## 2022-08-16 DIAGNOSIS — D563 Thalassemia minor: Secondary | ICD-10-CM

## 2022-08-16 DIAGNOSIS — Z3493 Encounter for supervision of normal pregnancy, unspecified, third trimester: Secondary | ICD-10-CM | POA: Diagnosis not present

## 2022-08-16 DIAGNOSIS — O98112 Syphilis complicating pregnancy, second trimester: Secondary | ICD-10-CM

## 2022-08-16 DIAGNOSIS — B9689 Other specified bacterial agents as the cause of diseases classified elsewhere: Secondary | ICD-10-CM

## 2022-08-16 DIAGNOSIS — O98113 Syphilis complicating pregnancy, third trimester: Secondary | ICD-10-CM

## 2022-08-16 DIAGNOSIS — N898 Other specified noninflammatory disorders of vagina: Secondary | ICD-10-CM

## 2022-08-16 LAB — CBC
Hematocrit: 30.4 % — ABNORMAL LOW (ref 34.0–46.6)
Hemoglobin: 9.9 g/dL — ABNORMAL LOW (ref 11.1–15.9)
MCH: 26.4 pg — ABNORMAL LOW (ref 26.6–33.0)
MCHC: 32.6 g/dL (ref 31.5–35.7)
MCV: 81 fL (ref 79–97)
Platelets: 276 10*3/uL (ref 150–450)
RBC: 3.75 x10E6/uL — ABNORMAL LOW (ref 3.77–5.28)
RDW: 13 % (ref 11.7–15.4)
WBC: 8.1 10*3/uL (ref 3.4–10.8)

## 2022-08-17 LAB — CERVICOVAGINAL ANCILLARY ONLY
Bacterial Vaginitis (gardnerella): POSITIVE — AB
Candida Glabrata: NEGATIVE
Candida Vaginitis: POSITIVE — AB
Chlamydia: NEGATIVE
Comment: NEGATIVE
Comment: NEGATIVE
Comment: NEGATIVE
Comment: NEGATIVE
Comment: NORMAL
Neisseria Gonorrhea: NEGATIVE

## 2022-08-20 LAB — CULTURE, BETA STREP (GROUP B ONLY): Strep Gp B Culture: NEGATIVE

## 2022-08-21 MED ORDER — TERCONAZOLE 0.4 % VA CREA: 1.0000 | TOPICAL_CREAM | Freq: Every day | VAGINAL | 1 refills | Status: DC

## 2022-08-21 MED ORDER — METRONIDAZOLE 500 MG PO TABS: 500.0000 mg | ORAL_TABLET | Freq: Two times a day (BID) | ORAL | 1 refills | Status: DC

## 2022-08-21 NOTE — Addendum Note (Signed)
Addended by: Dorathy Kinsman on: 08/21/2022 12:06 AM   Modules accepted: Orders

## 2022-08-23 ENCOUNTER — Encounter: Payer: Medicaid Other | Admitting: Medical

## 2022-08-23 NOTE — Progress Notes (Unsigned)
   PRENATAL VISIT NOTE  Subjective:  Paula Nunez is a 21 y.o. G3P1011 at [redacted]w[redacted]d being seen today for ongoing prenatal care.  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  Patient reports {sx:[id number]}.   .  .   . Denies leaking of fluid.   The following portions of the patient's history were reviewed and updated as appropriate: allergies, current medications, past family history, past medical history, past social history, past surgical history and problem list.   Objective:  There were no vitals filed for this visit.  Fetal Status:           General:  Alert, oriented and cooperative. Patient is in no acute distress.  Skin: Skin is warm and dry. No rash noted.   Cardiovascular: Normal heart rate noted  Respiratory: Normal respiratory effort, no problems with respiration noted  Abdomen: Soft, gravid, appropriate for gestational age.        Pelvic: {Blank single:19197::"Cervical exam performed in the presence of a chaperone","Cervical exam deferred"}        Extremities: Normal range of motion.     Mental Status: Normal mood and affect. Normal behavior. Normal judgment and thought content.   Assessment and Plan:  Pregnancy: G3P1011 at [redacted]w[redacted]d 1. 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 Supervision of other high risk pregnancy, antepartum ***  3. History of gestational hypertension - BP *** today  4. Anemia affecting pregnancy in third trimester Hgb up to 9.9. Continue PO iron an dietary iron.  5. Alpha thalassemia  silent carrier - Uncertainly paternity   6. [redacted] weeks gestation of pregnancy - GBS neg  Term labor symptoms and general obstetric precautions including but not limited to vaginal bleeding, contractions, leaking of fluid and fetal movement were reviewed in detail with the patient. Please refer to After Visit Summary for other counseling recommendations.   No follow-ups on file.  Future Appointments  Date Time Provider Department Center  08/24/2022  8:35 AM Dorathy Kinsman, [facility]  08/28/2022  2:30 PM [facility] NURSE [facility]  08/28/2022  2:45 PM [facility] US5 [facility]  08/30/2022  9:00 AM CENTERING PROVIDER [facility]  08/30/2022  2:55 PM Bernerd Limbo, CNM [facility]  09/13/2022  8:15 AM Warden Fillers, MD [facility]  09/13/2022  9:00 AM CENTERING PROVIDER [facility]  09/20/2022  2:35 PM Mora Appl [facility]  09/20/2022  3:15 PM [facility] NST [facility]    Dorathy Kinsman, CNM

## 2022-08-24 ENCOUNTER — Encounter: Payer: Self-pay | Admitting: *Deleted

## 2022-08-24 ENCOUNTER — Ambulatory Visit (INDEPENDENT_AMBULATORY_CARE_PROVIDER_SITE_OTHER): Payer: Medicaid Other | Admitting: Advanced Practice Midwife

## 2022-08-24 ENCOUNTER — Other Ambulatory Visit: Payer: Self-pay

## 2022-08-24 ENCOUNTER — Other Ambulatory Visit: Payer: Self-pay | Admitting: *Deleted

## 2022-08-24 VITALS — BP 134/64 | HR 87 | Wt 185.9 lb

## 2022-08-24 DIAGNOSIS — O09893 Supervision of other high risk pregnancies, third trimester: Secondary | ICD-10-CM

## 2022-08-24 DIAGNOSIS — O09899 Supervision of other high risk pregnancies, unspecified trimester: Secondary | ICD-10-CM

## 2022-08-24 DIAGNOSIS — O99013 Anemia complicating pregnancy, third trimester: Secondary | ICD-10-CM

## 2022-08-24 DIAGNOSIS — O98113 Syphilis complicating pregnancy, third trimester: Secondary | ICD-10-CM

## 2022-08-24 DIAGNOSIS — O480 Post-term pregnancy: Secondary | ICD-10-CM

## 2022-08-24 DIAGNOSIS — Z8759 Personal history of other complications of pregnancy, childbirth and the puerperium: Secondary | ICD-10-CM

## 2022-08-24 DIAGNOSIS — Z3A36 36 weeks gestation of pregnancy: Secondary | ICD-10-CM

## 2022-08-24 DIAGNOSIS — O98112 Syphilis complicating pregnancy, second trimester: Secondary | ICD-10-CM

## 2022-08-24 DIAGNOSIS — D563 Thalassemia minor: Secondary | ICD-10-CM

## 2022-08-28 ENCOUNTER — Encounter: Payer: Self-pay | Admitting: *Deleted

## 2022-08-28 ENCOUNTER — Ambulatory Visit: Payer: Medicaid Other | Attending: Obstetrics and Gynecology

## 2022-08-28 ENCOUNTER — Ambulatory Visit: Payer: Medicaid Other | Admitting: *Deleted

## 2022-08-28 VITALS — BP 124/59 | HR 75

## 2022-08-28 DIAGNOSIS — O99891 Other specified diseases and conditions complicating pregnancy: Secondary | ICD-10-CM | POA: Diagnosis not present

## 2022-08-28 DIAGNOSIS — Z8759 Personal history of other complications of pregnancy, childbirth and the puerperium: Secondary | ICD-10-CM | POA: Insufficient documentation

## 2022-08-28 DIAGNOSIS — O09899 Supervision of other high risk pregnancies, unspecified trimester: Secondary | ICD-10-CM | POA: Insufficient documentation

## 2022-08-28 DIAGNOSIS — A539 Syphilis, unspecified: Secondary | ICD-10-CM

## 2022-08-28 DIAGNOSIS — Z3A37 37 weeks gestation of pregnancy: Secondary | ICD-10-CM | POA: Diagnosis not present

## 2022-08-28 DIAGNOSIS — O09299 Supervision of pregnancy with other poor reproductive or obstetric history, unspecified trimester: Secondary | ICD-10-CM | POA: Diagnosis not present

## 2022-08-28 DIAGNOSIS — O98113 Syphilis complicating pregnancy, third trimester: Secondary | ICD-10-CM

## 2022-08-28 DIAGNOSIS — Z8632 Personal history of gestational diabetes: Secondary | ICD-10-CM | POA: Insufficient documentation

## 2022-08-28 DIAGNOSIS — O99013 Anemia complicating pregnancy, third trimester: Secondary | ICD-10-CM | POA: Insufficient documentation

## 2022-08-28 DIAGNOSIS — Z148 Genetic carrier of other disease: Secondary | ICD-10-CM | POA: Diagnosis not present

## 2022-08-28 DIAGNOSIS — O09293 Supervision of pregnancy with other poor reproductive or obstetric history, third trimester: Secondary | ICD-10-CM | POA: Diagnosis not present

## 2022-08-30 ENCOUNTER — Ambulatory Visit (INDEPENDENT_AMBULATORY_CARE_PROVIDER_SITE_OTHER): Payer: Medicaid Other | Admitting: Family Medicine

## 2022-08-30 ENCOUNTER — Encounter: Payer: Self-pay | Admitting: Certified Nurse Midwife

## 2022-08-30 VITALS — BP 130/75 | HR 76 | Wt 191.2 lb

## 2022-08-30 DIAGNOSIS — O09899 Supervision of other high risk pregnancies, unspecified trimester: Secondary | ICD-10-CM | POA: Diagnosis not present

## 2022-08-30 DIAGNOSIS — Z3A37 37 weeks gestation of pregnancy: Secondary | ICD-10-CM | POA: Diagnosis not present

## 2022-08-30 DIAGNOSIS — O09299 Supervision of pregnancy with other poor reproductive or obstetric history, unspecified trimester: Secondary | ICD-10-CM

## 2022-08-30 DIAGNOSIS — O2342 Unspecified infection of urinary tract in pregnancy, second trimester: Secondary | ICD-10-CM

## 2022-08-30 DIAGNOSIS — O99013 Anemia complicating pregnancy, third trimester: Secondary | ICD-10-CM

## 2022-08-30 DIAGNOSIS — Z8632 Personal history of gestational diabetes: Secondary | ICD-10-CM

## 2022-08-30 DIAGNOSIS — O09893 Supervision of other high risk pregnancies, third trimester: Secondary | ICD-10-CM

## 2022-08-30 DIAGNOSIS — Z8759 Personal history of other complications of pregnancy, childbirth and the puerperium: Secondary | ICD-10-CM

## 2022-08-30 DIAGNOSIS — O98112 Syphilis complicating pregnancy, second trimester: Secondary | ICD-10-CM

## 2022-08-30 DIAGNOSIS — O09293 Supervision of pregnancy with other poor reproductive or obstetric history, third trimester: Secondary | ICD-10-CM

## 2022-08-30 DIAGNOSIS — O98113 Syphilis complicating pregnancy, third trimester: Secondary | ICD-10-CM

## 2022-08-30 NOTE — Progress Notes (Signed)
   PRENATAL VISIT NOTE  Subjective:  Paula Nunez is a 21 y.o. G3P1011 at [redacted]w[redacted]d being seen today for ongoing prenatal care.  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  Patient reports backache.  Contractions: Irritability. Vag. Bleeding: None.  Movement: Present. Denies leaking of fluid.   The following portions of the patient's history were reviewed and updated as appropriate: allergies, current medications, past family history, past medical history, past social history, past surgical history and problem list.   Objective:   Vitals:   08/30/22 0844  BP: 130/75  Pulse: 76  Weight: 191 lb 3.2 oz (86.7 kg)    Fetal Status: Fetal Heart Rate (bpm): 130 Fundal Height: 37 cm Movement: Present  Presentation: Vertex  General:  Alert, oriented and cooperative. Patient is in no acute distress.  Skin: Skin is warm and dry. No rash noted.   Cardiovascular: Normal heart rate noted  Respiratory: Normal respiratory effort, no problems with respiration noted  Abdomen: Soft, gravid, appropriate for gestational age.  Pain/Pressure: Present     Pelvic: Cervical exam performed in the presence of a chaperone Dilation: 2 Effacement (%): Thick Station: -3  Extremities: Normal range of motion.     Mental Status: Normal mood and affect. Normal behavior. Normal judgment and thought content.   Assessment and Plan:  Pregnancy: G3P1011 at [redacted]w[redacted]d 1. 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 Additionally noted  that she received PCN and her titer has dropped 4 fold from July to Sept which represents cure.   2. Supervision of other high risk pregnancy, antepartum  Centering Pregnancy, Session#7: Reviewed resources in [employer].   Facilitated discussion today:  postpartum communication and postpartum mood changes Mindfulness activity with mindful listening  Fundal height and FHR appropriate today unless noted otherwise in plan. Patient to continue group care.    3. History of gestational hypertension BP is WNL today  4. History of gestational diabetes mellitus (GDM) in prior pregnancy, currently pregnant Screening was negative  5. Anemia affecting pregnancy in third trimester Lab Results  Component Value Date   HGB 9.9 (L) 08/16/2022   Encouraged PO iron   6. UTI (urinary tract infection) during pregnancy, second trimester TOC neg 11/6  Term labor symptoms and general obstetric precautions including but not limited to vaginal bleeding, contractions, leaking of fluid and fetal movement were reviewed in detail with the patient. Please refer to After Visit Summary for other counseling recommendations.   No follow-ups on file.  Future Appointments  Date Time Provider Department Center  09/07/2022  2:15 PM Eugenio Hoes [facility]  09/13/2022  9:00 AM CENTERING PROVIDER [facility]  09/20/2022 12:30 PM [facility] NURSE [facility]  09/20/2022 12:45 PM [facility] US7 [facility]  09/20/2022  2:35 PM Mora Appl [facility]    Federico Flake, MD

## 2022-08-31 ENCOUNTER — Encounter (HOSPITAL_COMMUNITY): Payer: Self-pay | Admitting: Obstetrics & Gynecology

## 2022-08-31 ENCOUNTER — Encounter: Payer: Self-pay | Admitting: Advanced Practice Midwife

## 2022-08-31 ENCOUNTER — Inpatient Hospital Stay (HOSPITAL_COMMUNITY)
Admission: AD | Admit: 2022-08-31 | Discharge: 2022-09-02 | DRG: 807 | Disposition: A | Discharge status: Home / Self Care | Payer: Medicaid Other | Attending: Obstetrics & Gynecology | Admitting: Obstetrics & Gynecology

## 2022-08-31 DIAGNOSIS — Z3A37 37 weeks gestation of pregnancy: Secondary | ICD-10-CM

## 2022-08-31 DIAGNOSIS — O26893 Other specified pregnancy related conditions, third trimester: Principal | ICD-10-CM | POA: Diagnosis present

## 2022-08-31 DIAGNOSIS — Z8632 Personal history of gestational diabetes: Secondary | ICD-10-CM

## 2022-08-31 LAB — CBC
HCT: 32.5 % — ABNORMAL LOW (ref 36.0–46.0)
Hemoglobin: 10.3 g/dL — ABNORMAL LOW (ref 12.0–15.0)
MCH: 26 pg (ref 26.0–34.0)
MCHC: 31.7 g/dL (ref 30.0–36.0)
MCV: 82.1 fL (ref 80.0–100.0)
Platelets: 243 10*3/uL (ref 150–400)
RBC: 3.96 MIL/uL (ref 3.87–5.11)
RDW: 14.6 % (ref 11.5–15.5)
WBC: 8.2 10*3/uL (ref 4.0–10.5)
nRBC: 0 % (ref 0.0–0.2)

## 2022-08-31 LAB — TYPE AND SCREEN
ABO/RH(D): O POS
Antibody Screen: NEGATIVE

## 2022-08-31 MED ORDER — MEDROXYPROGESTERONE ACETATE 150 MG/ML IM SUSP
150.0000 mg | INTRAMUSCULAR | Status: AC | PRN
  Administered 2022-09-02: 150 mg via INTRAMUSCULAR
  Filled 2022-08-31: qty 1

## 2022-08-31 MED ORDER — OXYTOCIN BOLUS FROM INFUSION
333.0000 mL | Freq: Once | INTRAVENOUS | Status: AC
  Administered 2022-08-31: 333 mL via INTRAVENOUS

## 2022-08-31 MED ORDER — OXYTOCIN-SODIUM CHLORIDE 30-0.9 UT/500ML-% IV SOLN
2.5000 [IU]/h | INTRAVENOUS | Status: DC
  Filled 2022-08-31: qty 500

## 2022-08-31 MED ORDER — COCONUT OIL OIL: 1.0000 | TOPICAL_OIL | Status: DC | PRN

## 2022-08-31 MED ORDER — DIBUCAINE (PERIANAL) 1 % EX OINT: 1.0000 | TOPICAL_OINTMENT | CUTANEOUS | Status: DC | PRN

## 2022-08-31 MED ORDER — LACTATED RINGERS IV SOLN: 500.0000 mL | INTRAVENOUS | Status: DC | PRN

## 2022-08-31 MED ORDER — ONDANSETRON HCL 4 MG/2ML IJ SOLN: 4.0000 mg | INTRAMUSCULAR | Status: DC | PRN

## 2022-08-31 MED ORDER — IBUPROFEN 600 MG PO TABS
600.0000 mg | ORAL_TABLET | Freq: Four times a day (QID) | ORAL | Status: DC
  Administered 2022-08-31 – 2022-09-02 (×7): 600 mg via ORAL
  Filled 2022-08-31 (×7): qty 1

## 2022-08-31 MED ORDER — DIPHENHYDRAMINE HCL 25 MG PO CAPS: 25.0000 mg | ORAL_CAPSULE | Freq: Four times a day (QID) | ORAL | Status: DC | PRN

## 2022-08-31 MED ORDER — PRENATAL MULTIVITAMIN CH
1.0000 | ORAL_TABLET | Freq: Every day | ORAL | Status: DC
  Administered 2022-09-01 – 2022-09-02 (×2): 1 via ORAL
  Filled 2022-08-31 (×2): qty 1

## 2022-08-31 MED ORDER — WITCH HAZEL-GLYCERIN EX PADS: 1.0000 | MEDICATED_PAD | CUTANEOUS | Status: DC | PRN

## 2022-08-31 MED ORDER — ACETAMINOPHEN 325 MG PO TABS: 650.0000 mg | ORAL_TABLET | ORAL | Status: DC | PRN

## 2022-08-31 MED ORDER — ZOLPIDEM TARTRATE 5 MG PO TABS: 5.0000 mg | ORAL_TABLET | Freq: Every evening | ORAL | Status: DC | PRN

## 2022-08-31 MED ORDER — ONDANSETRON HCL 4 MG PO TABS: 4.0000 mg | ORAL_TABLET | ORAL | Status: DC | PRN

## 2022-08-31 MED ORDER — SENNOSIDES-DOCUSATE SODIUM 8.6-50 MG PO TABS
2.0000 | ORAL_TABLET | ORAL | Status: DC
  Administered 2022-08-31 – 2022-09-01 (×2): 2 via ORAL
  Filled 2022-08-31 (×2): qty 2

## 2022-08-31 MED ORDER — BENZOCAINE-MENTHOL 20-0.5 % EX AERO
1.0000 | INHALATION_SPRAY | CUTANEOUS | Status: DC | PRN
  Administered 2022-08-31: 1 via TOPICAL
  Filled 2022-08-31: qty 56

## 2022-08-31 MED ORDER — LACTATED RINGERS IV SOLN: INTRAVENOUS | Status: DC

## 2022-08-31 MED ORDER — SOD CITRATE-CITRIC ACID 500-334 MG/5ML PO SOLN: 30.0000 mL | ORAL | Status: DC | PRN

## 2022-08-31 MED ORDER — SIMETHICONE 80 MG PO CHEW: 80.0000 mg | CHEWABLE_TABLET | ORAL | Status: DC | PRN

## 2022-08-31 MED ORDER — ONDANSETRON HCL 4 MG/2ML IJ SOLN: 4.0000 mg | Freq: Four times a day (QID) | INTRAMUSCULAR | Status: DC | PRN

## 2022-08-31 MED ORDER — FENTANYL CITRATE (PF) 100 MCG/2ML IJ SOLN
50.0000 ug | INTRAMUSCULAR | Status: DC | PRN
  Administered 2022-08-31: 100 ug via INTRAVENOUS
  Filled 2022-08-31: qty 2

## 2022-08-31 MED ORDER — TETANUS-DIPHTH-ACELL PERTUSSIS 5-2.5-18.5 LF-MCG/0.5 IM SUSY: 0.5000 mL | PREFILLED_SYRINGE | Freq: Once | INTRAMUSCULAR | Status: DC

## 2022-08-31 MED ORDER — LIDOCAINE HCL (PF) 1 % IJ SOLN: 30.0000 mL | INTRAMUSCULAR | Status: DC | PRN

## 2022-08-31 NOTE — H&P (Signed)
Paula Nunez is a 21 y.o. female presenting for active labor.  Patient reports she started having contractions around 0900 that have been regular.  She endorses fetal movement and denies LOF.   Patient receives care at [facility]  and was supervised for a low-risk pregnancy in [facility]. Pregnancy and medical history significant for problems as listed below. She is GBS negative and expresses a desire for epidural for pain management.  She is anticipating a female infant and requests depo for PP birth control method.      OB History     Gravida  3   Para  1   Term  1   Preterm      AB  1   Living  1      SAB  1   IAB      Ectopic      Multiple  0   Live Births  1          Past Medical History:  Diagnosis Date   Anemia    Anxiety    Depression    doing ok now   Gestational diabetes 11/08/2020   Gestational hypertension 11/09/2020   Headache    Pregnancy induced hypertension    SAB (spontaneous abortion) 12/07/2021   Past Surgical History:  Procedure Laterality Date   NO PAST SURGERIES     Family History: family history includes Diabetes in an other family member; Healthy in her father and mother; Hypertension in her maternal grandmother. Social History:  reports that she has never smoked. She has never used smokeless tobacco. She reports that she does not currently use alcohol after a past usage of about 1.0 standard drink of alcohol per week. She reports that she does not currently use drugs after having used the following drugs: Marijuana.   Nursing Staff Provider  Office Location [facility] Dating  [place] @ 8w 4d  [facility] Model _0  Traditional _1  Centering _2  Mom-Baby Dyad    Language  English Anatomy US  Normal  Flu Vaccine  Declined 06/21/22 Genetic/Carrier Screen  NIPS: LR   AFP: Neg Horizon: Silent carrier Alpha Thal _3 Parter testing-kit given (uncertain paternity)  TDaP Vaccine   Given 07/19/22 Hgb A1C or  GTT Early A1C 5.2 Third trimester Nml   COVID Vaccine no   LAB RESULTS   Rhogam  NA Blood Type O/Positive/-- (07/18 1359) O pos  Baby Feeding Plan Breast  Antibody Negative (07/18 1359)Neg  Contraception Depo ? Rubella 8.28 (07/18 1359)Immune  Circumcision Yes RPR Reactive (07/18 1359) POSITIVE 1:32  Pediatrician  [facility] HBsAg Negative (07/18 1359) NR  Support Person Theme park manager (FOB/partner) HCVAb Neg  Prenatal Classes Info given HIV Non Reactive (07/18 1359)   NR  BTL Consent NA GBS  Neg (For PCN allergy, check sensitivities)   VBAC Consent NA Pap 2022 Nml but absent transformation zone       DME Rx _4  BP cuff _5  Weight Scale Waterbirth  _6  Class _7  Consent _8  CNM visit  PHQ9 & GAD7 _9  new OB - 3/3 _10  28 weeks  _11  36 weeks Induction  _12  Orders Entered _13 Foley Y/N      Maternal Diabetes: No Genetic Screening: Normal Maternal Ultrasounds/Referrals: Normal Fetal Ultrasounds or other Referrals:  None Maternal Substance Abuse:  No Significant Maternal Medications:  None Significant Maternal Lab Results:  Group B Strep negative and Other: Reactive RPR Number of Prenatal Visits:greater than  3 verified prenatal visits Other Comments:  None  Review of Systems  Constitutional:  Negative for chills and fever.  Gastrointestinal:  Positive for abdominal pain (Ctx). Negative for nausea and vomiting.  Genitourinary:  Negative for difficulty urinating, dysuria, vaginal bleeding and vaginal discharge.  Neurological:  Negative for dizziness, light-headedness and headaches.   Maternal Medical History:  Reason for admission: Contractions.  Nausea.  Contractions: Onset was 3-5 hours ago.   Frequency: regular.   Perceived severity is moderate.   Fetal activity: Perceived fetal activity is normal.   Last perceived fetal movement was within the past hour.   Prenatal complications: Treated for Syphilis in 2nd Trimester Prenatal Complications - Diabetes: none.   Dilation: 5 Effacement (%): 90 Station:  -2 Exam by:: [employer], RN Blood pressure 135/61, pulse 86, temperature 97.9 F (36.6 C), temperature source Oral, resp. rate 12, height _0  (1.651 m), weight 86.4 kg, last menstrual period 09/28/2021, SpO2 99 %, unknown if currently breastfeeding. Maternal Exam:  Uterine Assessment: Contraction strength is moderate.  Contraction frequency is regular.  Abdomen: Patient reports no abdominal tenderness. Fundal height is AGA.   Estimated fetal weight is 7lbs.   Fetal presentation: vertex Introitus: not evaluated.   Pelvis: adequate for delivery.   Cervix: Cervix evaluated by digital exam.   Evaluated by nurse  Fetal Exam Fetal Monitor Review: Baseline rate: 145.  Variability: minimal (<5 bpm).   Pattern: accelerations present and no decelerations.   Fetal State Assessment: Category I - tracings are normal.   Physical Exam Vitals reviewed.  Constitutional:      General: She is not in acute distress.    Appearance: Normal appearance.  HENT:     Head: Normocephalic and atraumatic.  Eyes:     Conjunctiva/sclera: Conjunctivae normal.  Cardiovascular:     Rate and Rhythm: Normal rate and regular rhythm.     Heart sounds: Normal heart sounds.  Pulmonary:     Effort: Pulmonary effort is normal. No respiratory distress.  Abdominal:     Palpations: Abdomen is soft.     Tenderness: There is no abdominal tenderness.     Comments: Gravid  Musculoskeletal:     Cervical back: Normal range of motion.     Right lower leg: No edema.     Left lower leg: No edema.  Skin:    General: Skin is warm and dry.  Neurological:     Mental Status: She is alert and oriented to person, place, and time.  Psychiatric:        Mood and Affect: Mood normal.        Behavior: Behavior normal.     Prenatal labs: ABO, Rh: O/Positive/-- (07/18 1359) Antibody: Negative (07/18 1359) Rubella: 8.28 (07/18 1359) RPR: Reactive (09/21 0947)  HBsAg: Negative (07/18 1359)  HIV: Non Reactive (09/21 0947)   GBS: Negative/-- (11/22 1133)   Assessment/Plan: SIUP at 37.6 weeks Active Labor GBS Negative RPR Reactive H/O GHTN and GDM in previous pregnancy  Admit to [employer]  Routine Labor and Delivery Orders per Protocol In room to complete assessment and discuss POC: Okay for epidural as desired. Expectant Management Anticipate SVD   Maryann Conners MSN, CNM Advanced Practice Provider, Center for [place] 08/31/2022, 3:24 PM

## 2022-08-31 NOTE — Discharge Summary (Signed)
Postpartum Discharge Summary   Patient Name: Paula Nunez DOB: 25-Sep-2001 MRN: [id number]  Date of admission: 08/31/2022 Delivery date:08/31/2022  Delivering provider: Gavin Pound  Date of discharge: 09/02/2022  Admitting diagnosis: Indication for care in labor and delivery, antepartum [O75.9] Intrauterine pregnancy: [redacted]w[redacted]d    Secondary diagnosis:  Principal Problem:   Vaginal delivery Active Problems:   Indication for care in labor and delivery, antepartum   Labial abrasion, delivered, current hospitalization  Additional problems: None    Discharge diagnosis: Term Pregnancy Delivered                                              Post partum procedures: None Augmentation:  None Complications: None  Hospital course: Onset of Labor With Vaginal Delivery      21y.o. yo G3P1011 at 22w6das admitted in Active Labor on 08/31/2022. Labor course was uncomplicated.   Membrane Rupture Time/Date: 5:08 PM ,08/31/2022   Delivery Method:Vaginal, Spontaneous  Episiotomy: None  Lacerations:  None  Patient had a uncomplicated postpartum course.  She is ambulating, tolerating a regular diet, passing flatus, and urinating well. Patient is discharged home in stable condition on 09/02/22.  Newborn Data: Birth date:08/31/2022  Birth time:5:13 PM  Gender:Female -[name] Living status:Living  Apgars:9 ,9  Weight:3204 g   Magnesium Sulfate received: No BMZ received: No Rhophylac:N/A MMR:No T-DaP:Given prenatally Flu: No Transfusion:No  Physical exam  Vitals:   09/01/22 0810 09/01/22 1437 09/01/22 2137 09/02/22 0528  BP: (!) 128/57 (!) 133/57 (!) 124/56 (!) 128/56  Pulse: (!) 52  70 (!) 52  Resp: _0 Temp: 98.2 F (36.8 C) 98.1 F (36.7 C) 98.3 F (36.8 C) 98.2 F (36.8 C)  TempSrc: Oral Oral Oral Oral  SpO2: 100% 100% 100% 100%  Weight:      Height:       General: alert, cooperative, and no distress Lochia: appropriate Uterine Fundus: firm Incision: N/A DVT  Evaluation: No evidence of DVT seen on physical exam. Labs: Lab Results  Component Value Date   WBC 8.2 08/31/2022   HGB 10.3 (L) 08/31/2022   HCT 32.5 (L) 08/31/2022   MCV 82.1 08/31/2022   PLT 243 08/31/2022      Latest Ref Rng & Units 04/11/2022    1:59 PM  CMP  Glucose 70 - 99 mg/dL 85   BUN 6 - 20 mg/dL 11   Creatinine 0.57 - 1.00 mg/dL 0.50   Sodium 134 - 144 mmol/L 136   Potassium 3.5 - 5.2 mmol/L 4.1   Chloride 96 - 106 mmol/L 103   CO2 20 - 29 mmol/L 22   Calcium 8.7 - 10.2 mg/dL 9.1   Total Protein 6.0 - 8.5 g/dL 6.4   Total Bilirubin 0.0 - 1.2 mg/dL <0.2   Alkaline Phos 44 - 121 IU/L 53   AST 0 - 40 IU/L 10   ALT 0 - 32 IU/L 5    Edinburgh Score:    08/31/2022    9:00 PM  Edinburgh Postnatal Depression Scale Screening Tool  I have been able to laugh and see the funny side of things. 0  I have looked forward with enjoyment to things. 1  I have blamed myself unnecessarily when things went wrong. 0  I have been anxious or worried for no good reason. 0  I have felt  scared or panicky for no good reason. 1  Things have been getting on top of me. 1  I have been so unhappy that I have had difficulty sleeping. 0  I have felt sad or miserable. 0  I have been so unhappy that I have been crying. 0  The thought of harming myself has occurred to me. 0  Edinburgh Postnatal Depression Scale Total 3     After visit meds:  Allergies as of 09/02/2022   No Known Allergies      Medication List     TAKE these medications    benzocaine-Menthol 20-0.5 % Aero Commonly known as: DERMOPLAST Apply 1 Application topically as needed for irritation (perineal discomfort).   Prenatal Adult Gummy/DHA/FA 0.4-25 MG Chew Chew 1 Dose by mouth daily.         Discharge home in stable condition Infant Feeding: Breast Infant Disposition:home with mother Discharge instruction: per After Visit Summary and Postpartum booklet. Activity: Advance as tolerated. Pelvic rest for 6  weeks.  Diet: routine diet Future Appointments: Future Appointments  Date Time Provider [place]  09/20/2022 12:30 PM [facility]URSE [facility]  09/20/2022 12:45 PM [facility] US7 [facility]  10/13/2022 10:15 AM Manya Silvas, CNM [facility]   Follow up Visit: Message Sent 08/31/2022   Please schedule this patient for a Virtual postpartum visit in 6 weeks with the following provider: Any provider. Additional Postpartum F/U: None   Low risk pregnancy complicated by:  H/O GDM and GHTN Delivery mode:  Vaginal, Spontaneous  Anticipated Birth Control:  Depo   09/02/2022 Concepcion Living, MD

## 2022-08-31 NOTE — MAU Note (Signed)
.  Paula Nunez is a 21 y.o. at [redacted]w[redacted]d here in MAU reporting: ctx that she rates 10/10 that began around 0930 this morning and have progressively gotten worse.  She also reports some trickling of clear, watery fluid at 1235. Denies VB and reports good FM.  Last SVE: 2 cm yesterday LMP: N/A Onset of complaint: today Pain score: 10/10 Vitals:   08/31/22 1348 08/31/22 1357  BP: 139/76 135/61  Pulse: 83 86  Resp: 12   Temp: 97.9 F (36.6 C)   SpO2: 99%      FHT:150 Lab orders placed from triage:  MAU labor

## 2022-08-31 NOTE — Progress Notes (Signed)
Paula Nunez 04/14/2001 [id number]   CIRCUMCISION CONSENT  [name] expresses desire for infant circumcision.  Informed that [facility] can perform said procedure and circumcision procedure details discussed.    -It was emphasized that this is an elective procedure.   -Risks and benefits of procedure were reviewed including, but not limited to:  *Benefits include reduction in the rates of urinary tract infection (UTI), penile cancer, some sexually transmitted infections, penile inflammatory, and retractile disorders, as well as easier hygiene.   *Risks include bleeding, infection, injury of glans which may lead to need for additional surgery, penile deformity, or urinary tract issues, unsatisfactory cosmetic appearance and other potential complications related to the procedure.   -Informed that procedure will not be performed if provider deems inappropriate d/t penile size, noted deformity, or unsatisfactory pediatric evaluation. -Patient wants to proceed with circumcision. -Circumcision to be done pending pediatric evaluation of infant.  -Post circumcision care discussed. -L&D Team updated  Cherre Robins MSN, CNM Advanced Practice Provider, Center for [facility] 08/31/2022 7:03 PM

## 2022-09-01 LAB — RPR
RPR Ser Ql: REACTIVE — AB
RPR Titer: 1:8 {titer}

## 2022-09-01 NOTE — Social Work (Signed)
CSW acknowledges consult.  CSW attempted to meet with MOB, however MOB had several room guest.  CSW will attempt to visit with MOB at a later time.   Kathleen Likins, LCSWA Clinical Social Worker [phone]  

## 2022-09-01 NOTE — Progress Notes (Signed)
POSTPARTUM PROGRESS NOTE  Post Partum Day 1  Subjective:  Paula Nunez is a 21 y.o. [id number] s/p SVD at [redacted]w[redacted]d.  She reports she is doing well. No acute events overnight. She denies any problems with ambulating, voiding or po intake. Denies nausea or vomiting.  Pain is well controlled.  Lochia is appropriate.  Objective: Blood pressure (!) 125/56, pulse (!) 57, temperature 98.3 F (36.8 C), temperature source Oral, resp. rate 18, height 5\' 5"  (1.651 m), weight 86.4 kg, last menstrual period 09/28/2021, SpO2 100 %, unknown if currently breastfeeding.  Physical Exam:  General: alert, cooperative and no distress Chest: no respiratory distress Heart:regular rate, distal pulses intact Abdomen: soft, nontender,  Uterine Fundus: firm, appropriately tender DVT Evaluation: No calf swelling or tenderness Extremities: no edema Skin: warm, dry  Recent Labs    08/31/22 1531  HGB 10.3*  HCT 32.5*    Assessment/Plan: Paula Nunez is a 21 y.o. 21 s/p SVD at 104w6d   PPD#1 - Doing well  Routine postpartum care  Contraception: undecided  Feeding: breast Dispo: Plan for discharge tomorrow.   LOS: 1 day   [redacted]w[redacted]d, MD  09/01/2022, 10:21 AM

## 2022-09-02 MED ORDER — BENZOCAINE-MENTHOL 20-0.5 % EX AERO: 1.0000 | INHALATION_SPRAY | CUTANEOUS | 3 refills | Status: DC | PRN

## 2022-09-02 NOTE — Clinical Social Work Maternal (Signed)
CLINICAL SOCIAL WORK MATERNAL/CHILD NOTE  Patient Details  Name: Nemesis Rainwater MRN: [id number] Date of Birth: 2001/06/20  Date:  09/02/2022  Clinical Social Worker Initiating Note:  Idamae Lusher, LCSWA Date/Time: Initiated:  09/02/22/1611     Child's Name:  Gunnar Bulla   Biological Parents:  Mother, Father (FOB: Vernice Jefferson DOB: 06/18/2004)   Need for Interpreter:  None   Reason for Referral:  Behavioral Health Concerns, Current Substance Use/Substance Use During Pregnancy     Address:  [address] [place] [address]    Phone number:  [phone]  Additional phone number:   Household Members/Support Persons (HM/SP):   Household Member/Support Person 1, Household Member/Support Person 2   HM/SP Name Relationship DOB or Age  HM/SP -1   Grandmother    HM/SP -2 Damarris Manahan Son 11/09/2020  HM/SP -3        HM/SP -4        HM/SP -5        HM/SP -6        HM/SP -7        HM/SP -8          Natural Supports (not living in the home):  Immediate Family, Extended Family   Professional Supports: None   Employment: Part-time   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   Homebound arranged:    Museum/gallery curator Resources:  [employer]   Other Resources:  [employer], Physicist, medical     Cultural/Religious Considerations Which May Impact Care:  None identified  Strengths:  Ability to meet basic needs  , Home prepared for child  , Pediatrician chosen   Psychotropic Medications:         Pediatrician:    [place]  Pediatrician List:   [facility] for [place]      Pediatrician Fax Number:    Risk Factors/Current Problems:  Substance Use  , Mental Health Concerns     Cognitive State:  Alert  , Able to Concentrate  , Linear Thinking  , Goal Oriented     Mood/Affect:  Calm  , Relaxed  , Interested  ,  Comfortable     CSW Assessment: CSW was consulted due to history of anxiety and depression and THC use during pregnancy. CSW met with MOB at bedside to complete assessment. When CSW entered room, MOB was observed sitting in hospital bed, holding infant. FOB and visitors were present. CSW introduced self and requested to speak with MOB alone. Visitors left room, FOB remained with verbal consent from MOB. CSW explained reasons for consult. MOB presented as calm, was agreeable to consult and remained engaged throughout encounter.   CSW inquired about MOB's mental health history. MOB reports she was diagnosed with depression and anxiety in 2015. MOB shared her mother passed away when MOB was 55 years old and she first began experiencing symptoms of depression and anxiety around this time. MOB reports experiencing some depressive symptoms during pregnancy, marked by feeling sad, tearful, and low energy at times but shares she was able to manage symptoms without additional support. MOB reports she does not take psychotropic medication and is not current with a therapist. MOB was accepting of outpatient mental health resources. MOB reports she experienced postpartum depression after the birth of her son Damarris, marked by crying and not knowing  the cause. MOB reports she did feel bonded to her son and her PMADs symptoms resolved on their own after about 1 month postpartum. MOB identified FOB, her family, and FOB's family as supports. MOB denied current SI/HI.   MOB reports she has all needed items for infant, including a car seat and bassinet. MOB receives [facility] and food stamps. MOB declined additional resource needs.  CSW informed MOB about hospital drug screen policy due to [facility] use during pregnancy. CSW explained that infant's UDS and CDS would be monitored and a CPS report would be made if warranted. MOB verbalized understanding. CSW inquired about substance use during pregnancy. MOB reports she smoked marijuana  during pregnancy due to feeling nauseous, stating she smoked to help her eat. MOB shared that she was prescribed nausea medication during pregnancy but it did not help. MOB reports she smoked marijuana a few times per week, last use 08/15/2022. MOB denied using other illegal substances during pregnancy. MOB denied a history of CPS involvement.   CSW provided education regarding the baby blues period vs. perinatal mood disorders, discussed treatment and gave resources for mental health follow up if concerns arise.  CSW recommends self-evaluation during the postpartum time period using the New Mom Checklist from Postpartum Progress and encouraged MOB to contact a medical professional if symptoms are noted at any time.    CSW provided review of Sudden Infant Death Syndrome (SIDS) precautions.    CSW identifies no barriers to discharge at this time.  CSW Plan/Description:  No Further Intervention Required/No Barriers to Discharge, Sudden Infant Death Syndrome (SIDS) Education, Perinatal Mood and Anxiety Disorder (PMADs) Education, [place], CSW Will Continue to Monitor Umbilical Cord Tissue Drug Screen Results and Make Report if Herma Carson, LCSWA 09/02/2022, 4:16 PM

## 2022-09-04 LAB — T.PALLIDUM AB, TOTAL: T Pallidum Abs: REACTIVE — AB

## 2022-09-05 ENCOUNTER — Telehealth: Payer: Self-pay | Admitting: *Deleted

## 2022-09-05 NOTE — Patient Outreach (Addendum)
Transitional Care Management Follow-up Telephone Call  Amritha Fouse was discharged from [facility] at [facility]), has or will have a pregnancy risk assessment at next visit, has a scheduled  Postpartum   appointment with the [facility] provider on 10/13/22 date, and has been referred for appropriate case management and ongoing follow up.   Estanislado Emms RN, BSN [place]  [employer]

## 2022-09-07 ENCOUNTER — Encounter: Payer: Self-pay | Admitting: *Deleted

## 2022-09-07 ENCOUNTER — Encounter: Payer: Medicaid Other | Admitting: Advanced Practice Midwife

## 2022-09-08 ENCOUNTER — Telehealth (HOSPITAL_COMMUNITY): Payer: Self-pay | Admitting: *Deleted

## 2022-09-08 NOTE — Telephone Encounter (Signed)
Busy signal received. Unable to contact.  Duffy Rhody, RN 09-08-2022 at 9:37am

## 2022-09-13 ENCOUNTER — Encounter: Payer: Self-pay | Admitting: Obstetrics and Gynecology

## 2022-09-20 ENCOUNTER — Other Ambulatory Visit: Payer: Self-pay

## 2022-09-20 ENCOUNTER — Ambulatory Visit: Payer: Medicaid Other

## 2022-09-20 ENCOUNTER — Encounter: Payer: Self-pay | Admitting: Advanced Practice Midwife

## 2022-10-13 ENCOUNTER — Ambulatory Visit: Payer: Medicaid Other | Admitting: Advanced Practice Midwife

## 2022-10-16 ENCOUNTER — Other Ambulatory Visit: Payer: Self-pay

## 2022-10-16 ENCOUNTER — Ambulatory Visit (INDEPENDENT_AMBULATORY_CARE_PROVIDER_SITE_OTHER): Payer: Medicaid Other | Admitting: Obstetrics and Gynecology

## 2022-10-16 VITALS — BP 128/72 | HR 86 | Ht 65.0 in | Wt 186.1 lb

## 2022-10-16 DIAGNOSIS — Z8619 Personal history of other infectious and parasitic diseases: Secondary | ICD-10-CM

## 2022-10-16 DIAGNOSIS — Z5189 Encounter for other specified aftercare: Secondary | ICD-10-CM

## 2022-10-16 NOTE — Progress Notes (Signed)
[place] Partum Visit Note  Paula Nunez is a 22 y.o. G63P2012 female who presents for a postpartum visit. She is 6 weeks postpartum following a normal spontaneous vaginal delivery.  I have fully reviewed the prenatal and intrapartum course. The delivery was at [redacted]w[redacted]d gestational weeks.  Anesthesia: none. Postpartum course has been uncomplicated. Baby is doing well. Baby is feeding by bottle - Similac Sensitive RS. Bleeding no bleeding. Bowel function is normal. Bladder function is normal. Patient is not sexually active. Contraception method is Depo-Provera injections. Postpartum depression screening: negative.   The pregnancy intention screening data noted above was reviewed. Potential methods of contraception were discussed. The patient elected to proceed with depo provera   Edinburgh Postnatal Depression Scale - 10/16/22 1630       Edinburgh Postnatal Depression Scale:  In the Past 7 Days   I have been able to laugh and see the funny side of things. 0    I have looked forward with enjoyment to things. 1    I have blamed myself unnecessarily when things went wrong. 0    I have been anxious or worried for no good reason. 2    I have felt scared or panicky for no good reason. 0    Things have been getting on top of me. 2    I have been so unhappy that I have had difficulty sleeping. 0    I have felt sad or miserable. 1    I have been so unhappy that I have been crying. 2    The thought of harming myself has occurred to me. 0    Edinburgh Postnatal Depression Scale Total 8             Health Maintenance Due  Topic Date Due   COVID-19 Vaccine (1) Never done   HPV VACCINES (1 - 2-dose series) Never done    The following portions of the patient's history were reviewed and updated as appropriate: allergies, current medications, past family history, past medical history, past social history, past surgical history, and problem list.  Review of Systems Pertinent items are noted in  HPI.  Objective:  BP 128/72   Pulse 86   Ht 5\' 5"  (1.651 m)   Wt 186 lb 1.6 oz (84.4 kg)   LMP 10/11/2022 (Exact Date)   Breastfeeding No   BMI 30.97 kg/m    General:  alert, cooperative, and no distress   Breasts:  not indicated  Lungs: clear to auscultation bilaterally  Heart:  regular rate and rhythm  Abdomen: soft, non-tender; bowel sounds normal; no masses,  no organomegaly   Wound N/a  GU exam:  normal       Assessment:  normal postpartum exam.   Plan:   Essential components of care per ACOG recommendations:  1.  Mood and well being: Patient with negative depression screening today. Reviewed local resources for support.  - Patient tobacco use? No.   - hx of drug use? No.    2. Infant care and feeding:  -Patient currently breastmilk feeding? No.  -Social determinants of health (SDOH) reviewed in EPIC. No concerns.  3. Sexuality, contraception and birth spacing - Patient does not want a pregnancy in the next year.  Desired family size is 3 children.  - Reviewed reproductive life planning. Reviewed contraceptive methods based on pt preferences and effectiveness.  Patient desired Hormonal Injection today.   - Discussed birth spacing of 18 months  4. Sleep and fatigue -Encouraged family/partner/community  support of 4 hrs of uninterrupted sleep to help with mood and fatigue  5. Physical Recovery  - Discussed patients delivery and complications. She describes her labor as good. - Patient had a Vaginal, no problems at delivery. Patient had  no  laceration. Perineal healing reviewed. Patient expressed understanding - Patient has urinary incontinence? No. - Patient is safe to resume physical and sexual activity  6.  Health Maintenance - HM due items addressed Yes - Last pap smear  Diagnosis  Date Value Ref Range Status  07/20/2021   Final   - Negative for intraepithelial lesion or malignancy (NILM)   Pap smear not done at today's visit.  -Breast Cancer screening  indicated? No.   7. Chronic Disease/Pregnancy Condition follow up:  syphilis  - PCP follow up F/u in 6 months for repeat RPR  Griffin Basil, [place] for [employer], [place]

## 2022-10-31 ENCOUNTER — Ambulatory Visit: Payer: Medicaid Other | Admitting: Clinical

## 2022-11-10 NOTE — BH Specialist Note (Signed)
Integrated Behavioral Health via Telemedicine Visit  11/21/2022 Paula Nunez [id number]  Number of [place] Clinician visits: 1- Initial Visit  Session Start time: 1519   Session End time: [id number]  Total time in minutes: 19   Referring Provider: Lynnda Shields, MD Paula/Family location: [facility] Provider location: [facility] for [place] [facility]  All persons participating in visit: Paula Nunez and [place]   Types of Service: Individual psychotherapy and Telephone visit  I connected with Starlena Bruster and/or Hoang Temples's  n/a  via  Telephone or Video Enabled Telemedicine Application  (Video is Caregility application) and verified that I am speaking with the correct person using two identifiers. Discussed confidentiality: Yes   I discussed the limitations of telemedicine and the availability of in person appointments.  Discussed there is a possibility of technology failure and discussed alternative modes of communication if that failure occurs.  I discussed that engaging in this telemedicine visit, they consent to the provision of behavioral healthcare and the services will be billed under their insurance.  Paula and/or legal guardian expressed understanding and consented to Telemedicine visit: Yes   Presenting Concerns: Paula and/or family reports the following symptoms/concerns: Mild fatigue, worry and dread that comes and goes and feels manageable; good appetite, sleep is improving and good support at home.  Duration of problem: Postpartum; Severity of problem: mild  Paula and/or Family's Strengths/Protective Factors: Social connections and Sense of purpose  Goals Addressed: Paula will:  Reduce symptoms of: anxiety   Increase knowledge and/or ability of: healthy habits   Demonstrate ability to: Increase healthy adjustment to current life circumstances and Increase adequate  support systems for Paula/family  Progress towards Goals: Ongoing  Interventions: Interventions utilized:  Functional Assessment of ADLs, Psychoeducation and/or Health Education, and Link to [employer] Standardized Assessments completed: GAD-7 and PHQ 9  Paula and/or Family Response: Paula agrees with treatment plan.   Assessment: Paula currently experiencing Other specified counseling, postpartum mood check.   Paula may benefit from psychoeducation and brief therapeutic interventions regarding coping with symptoms of mild anxiety .  Plan: Follow up with behavioral health clinician on : Call Ronne Stefanski at [phone], as needed. Behavioral recommendations:  -Continue prioritizing healthy self-care (regular meals, adequate rest; allowing practical help from supportive friends and family as needed) daily -Consider new mom support group as needed at either www.postpartum.net or www.conehealthybaby.com   Referral(s): [place] (In Clinic) and [employer]:  new parent support  I discussed the assessment and treatment plan with the Paula and/or parent/guardian. They were provided an opportunity to ask questions and all were answered. They agreed with the plan and demonstrated an understanding of the instructions.   They were advised to call back or seek an in-person evaluation if the symptoms worsen or if the condition fails to improve as anticipated.  Turner, LCSW     11/21/2022    3:25 PM 07/19/2022    2:55 PM 06/21/2022    4:17 PM 04/11/2022    2:25 PM 12/07/2021    4:01 PM  Depression screen PHQ 2/9  Decreased Interest 1 0 2 1 0  Down, Depressed, Hopeless 0 1 0 0 1  PHQ - 2 Score '1 1 2 1 1  '$ Altered sleeping '1 1 1 '$ 0 2  Tired, decreased energy '1 1 1 1 2  '$ Change in appetite 0 0 0 1 0  Feeling bad or failure about yourself  0 0 0  0 0  Trouble concentrating 0 0 0 0 0  Moving slowly or fidgety/restless 0 0 0 0 0   Suicidal thoughts 0 0 0 0 0  PHQ-9 Score '3 3 4 3 5      '$ 11/21/2022    3:27 PM 07/19/2022    2:55 PM 06/21/2022    4:18 PM 04/11/2022    2:25 PM  GAD 7 : Generalized Anxiety Score  Nervous, Anxious, on Edge 0 0 1 1  Control/stop worrying '1 2 1 '$ 0  Worry too much - different things 0 1 1 0  Trouble relaxing 0 0 0 0  Restless 0 0 0 0  Easily annoyed or irritable 0 '1 3 2  '$ Afraid - awful might happen 1 0 1 0  Total GAD 7 Score '2 4 7 '$ 3

## 2022-11-20 ENCOUNTER — Ambulatory Visit: Payer: Self-pay

## 2022-11-21 ENCOUNTER — Ambulatory Visit (INDEPENDENT_AMBULATORY_CARE_PROVIDER_SITE_OTHER): Payer: Medicaid Other | Admitting: Clinical

## 2022-11-21 DIAGNOSIS — F419 Anxiety disorder, unspecified: Secondary | ICD-10-CM | POA: Diagnosis not present

## 2022-11-21 DIAGNOSIS — Z7189 Other specified counseling: Secondary | ICD-10-CM | POA: Diagnosis not present

## 2022-11-21 NOTE — Patient Instructions (Signed)
Center for [facility] for [place], [place] [address] [phone] (main office) [phone] [facility] office)  New Parent Support Groups www.postpartum.net www.conehealthybaby.com  Armed forces training and education officer  (Childcare options, Early childcare development, etc.) [employer]no  [place]  [email or web address]

## 2022-11-23 ENCOUNTER — Ambulatory Visit (INDEPENDENT_AMBULATORY_CARE_PROVIDER_SITE_OTHER): Payer: Medicaid Other | Admitting: *Deleted

## 2022-11-23 ENCOUNTER — Other Ambulatory Visit: Payer: Self-pay

## 2022-11-23 VITALS — BP 131/84 | HR 64 | Ht 68.0 in | Wt 193.3 lb

## 2022-11-23 DIAGNOSIS — Z3042 Encounter for surveillance of injectable contraceptive: Secondary | ICD-10-CM

## 2022-11-23 MED ORDER — MEDROXYPROGESTERONE ACETATE 150 MG/ML IM SUSP
150.0000 mg | Freq: Once | INTRAMUSCULAR | Status: AC
  Administered 2022-11-23: 150 mg via INTRAMUSCULAR

## 2022-11-23 NOTE — Progress Notes (Signed)
Here for depo-provera. Last given 08/31/22. Last exam was postpartum on 10/16/22. Last pap was 07/20/21. Injection given without complaint. Sent to front desk to schedule next injection. Staci Acosta

## 2022-11-29 ENCOUNTER — Encounter: Payer: Self-pay | Admitting: *Deleted

## 2022-12-08 ENCOUNTER — Encounter: Payer: Self-pay | Admitting: *Deleted

## 2023-02-09 ENCOUNTER — Ambulatory Visit (INDEPENDENT_AMBULATORY_CARE_PROVIDER_SITE_OTHER): Payer: 59 | Admitting: General Practice

## 2023-02-09 VITALS — BP 133/57 | HR 67 | Ht 65.0 in | Wt 192.0 lb

## 2023-02-09 DIAGNOSIS — Z3042 Encounter for surveillance of injectable contraceptive: Secondary | ICD-10-CM | POA: Diagnosis not present

## 2023-02-09 MED ORDER — MEDROXYPROGESTERONE ACETATE 150 MG/ML IM SUSY
150.0000 mg | PREFILLED_SYRINGE | Freq: Once | INTRAMUSCULAR | Status: AC
Start: 2023-02-09 — End: 2023-02-09
  Administered 2023-02-09: 150 mg via INTRAMUSCULAR

## 2023-02-09 NOTE — Progress Notes (Signed)
Paula Nunez here for Depo-Provera Injection. Injection administered without complication. Patient will return in 3 months for next injection between 8/2 and 8/16. Next annual visit due January 2025.   Marylynn Pearson, RN 02/09/2023  10:46 AM

## 2023-03-07 IMAGING — US US OB < 14 WEEKS - US OB TV
1 series · 15 of 28 positions shown · non-contrast
Comparison: None.

CLINICAL DATA: Vaginal bleeding during pregnancy.

EXAM:
OBSTETRIC <14 WK US AND TRANSVAGINAL OB US
TECHNIQUE: Both transabdominal and transvaginal ultrasound examinations were
performed for complete evaluation of the gestation as well as the
maternal uterus, adnexal regions, and pelvic cul-de-sac.
Transvaginal technique was performed to assess early pregnancy.

[Series 1: us ob < 14 weeks - us ob tv · 32 acquisitions, 15 frames shown]
[im 1/32]
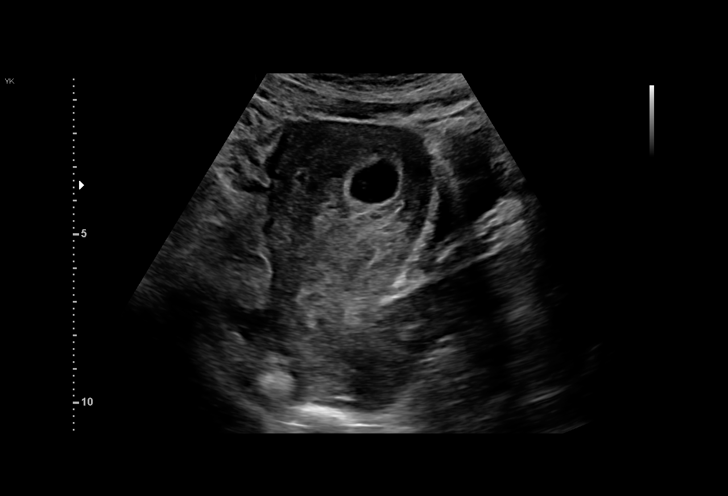
[im 3/32]
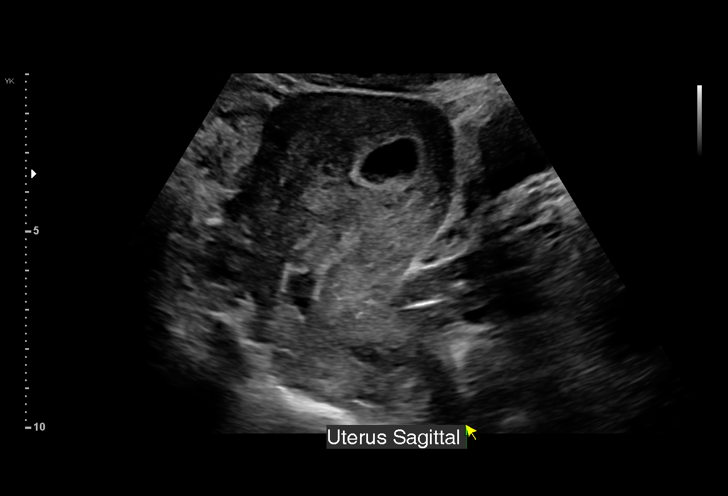
[im 5/32]
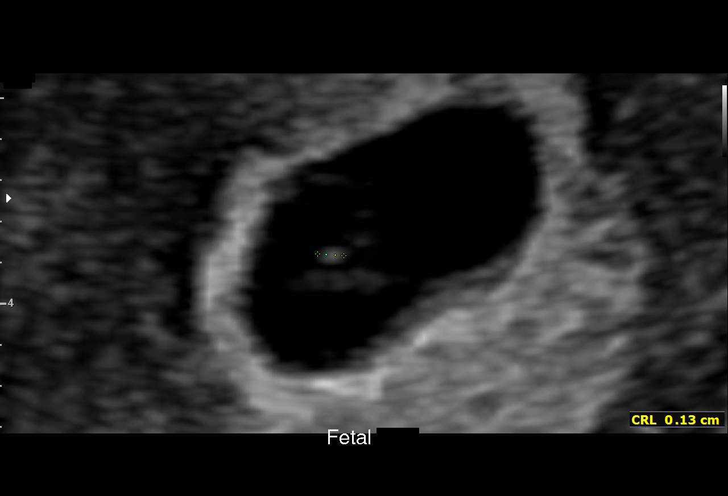
[im 7/32]
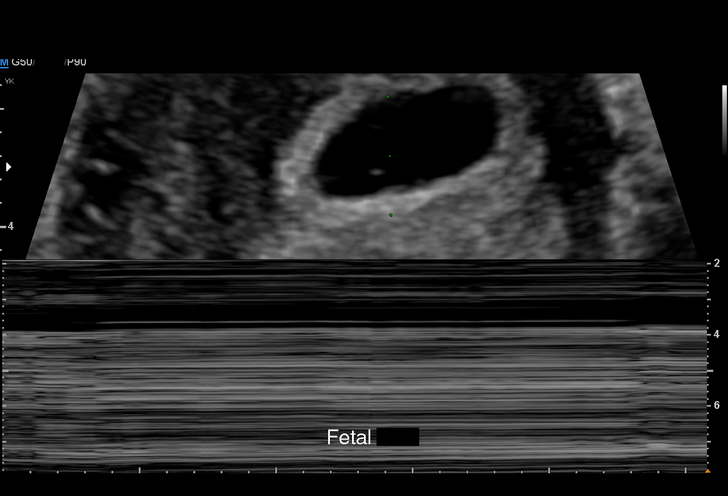
[im 10/32]
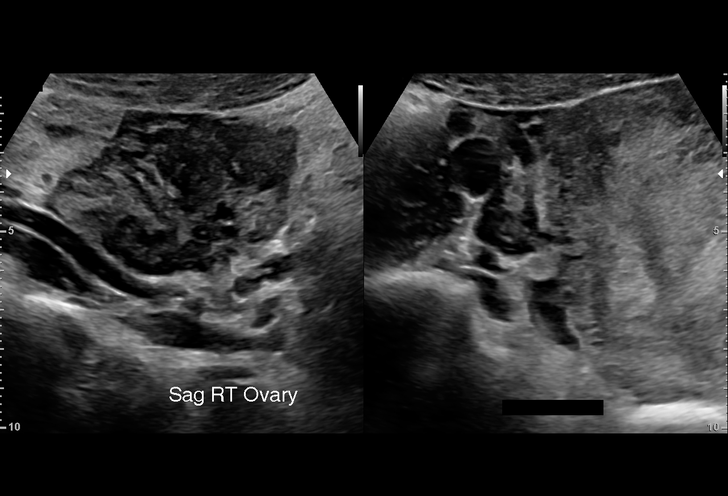
[im 12/32]
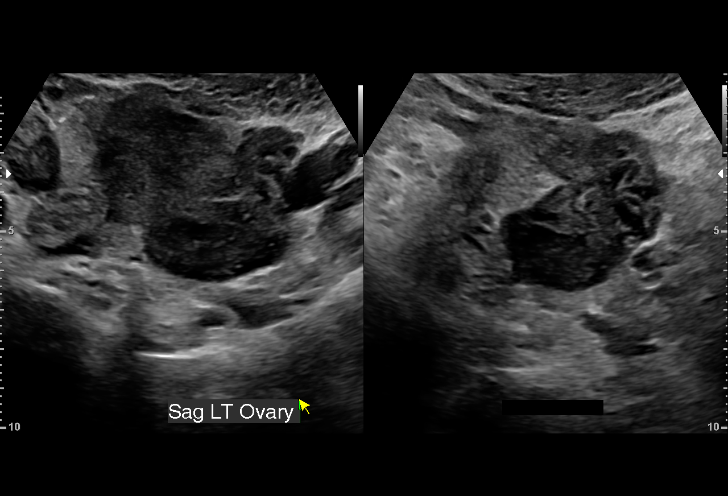
[im 14/32]
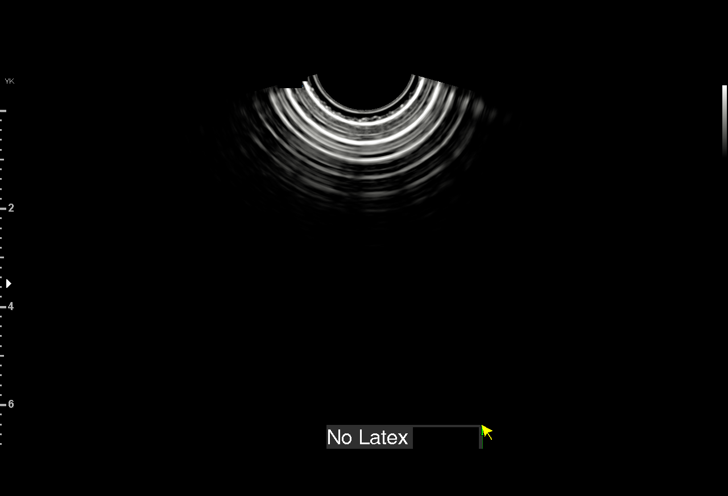
[im 17/32]
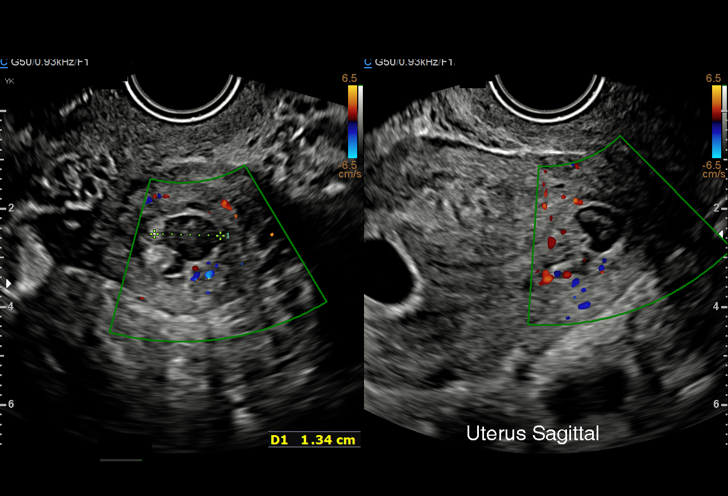
[im 18/32]
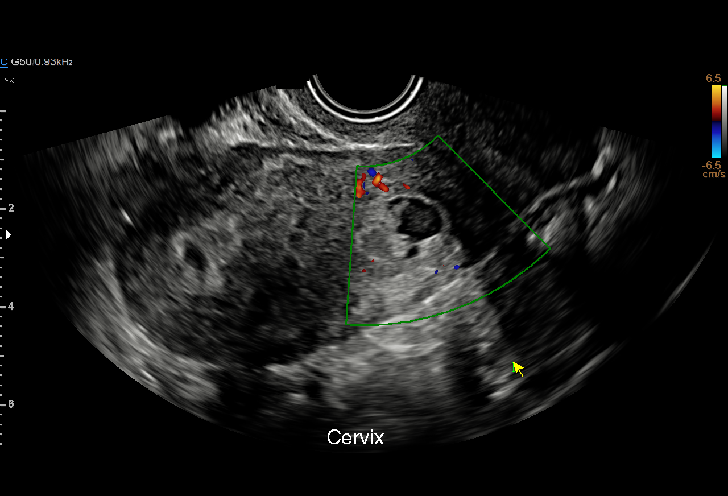
[im 20/32]
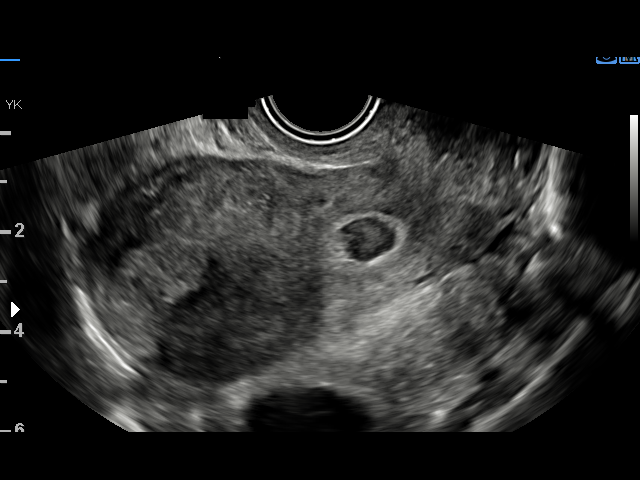
[im 22/32]
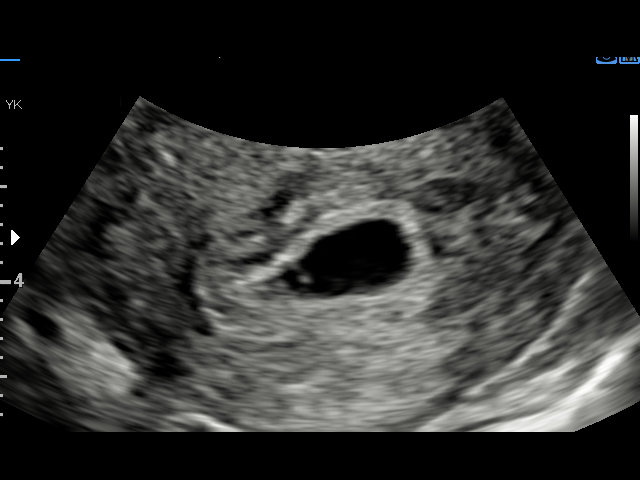
[im 25/32]
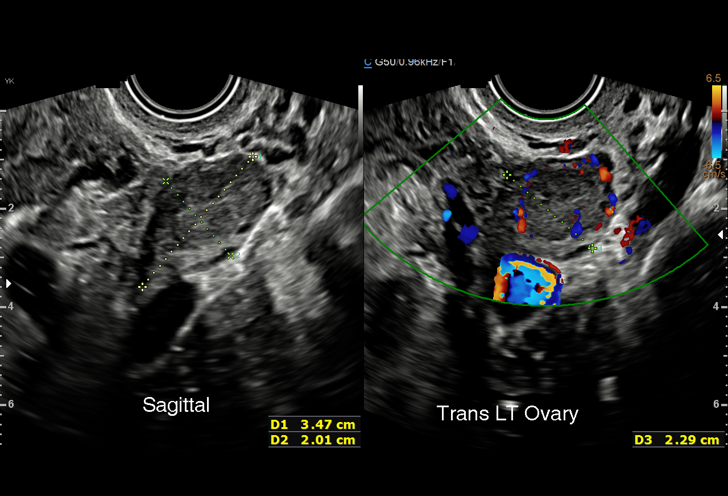
[im 27/32]
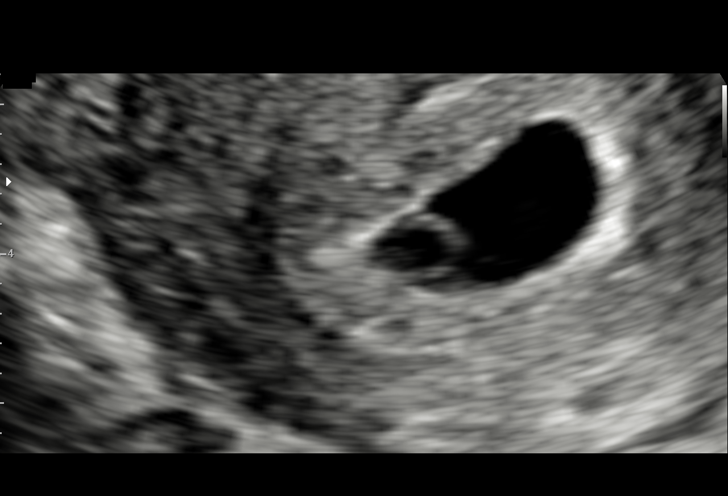
[im 29/32]
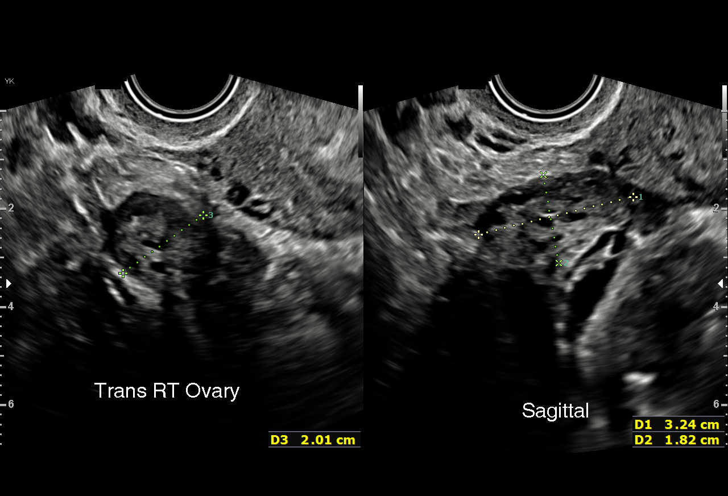
[im 32/32]
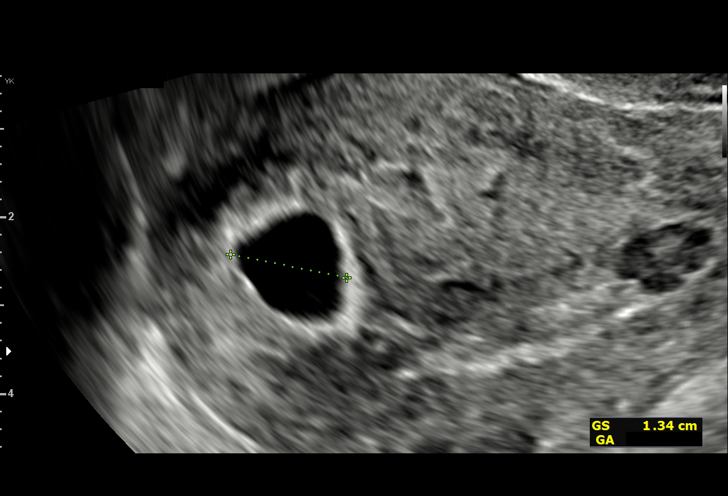

[15 of 28 positions shown; findings below may reference images not displayed]

FINDINGS: Intrauterine gestational sac: Single

Yolk sac:  Not Visualized.

Embryo:  Visualized.

Cardiac Activity: Not Visualized.

Heart Rate: 0 bpm

MSD: 12.8 mm   6 w   0 d

CRL:  1.5 mm.  Too small to calculate gestational age.

Subchorionic hemorrhage:  None visualized.

Maternal uterus/adnexae: Bilateral ovaries appear within normal
limits. No free fluid.

Additional findings: There is a 1.3 x 1.3 x 1.1 cm heterogeneous
nonvascular area in the lower uterine segment endometrium which
appears complex and cystic.
IMPRESSION: 1. Single intrauterine gestation. Discordant crown-rump length and
mean gestational sac diameter. No cardiac activity identified.
Findings are suspicious but not yet definitive for failed pregnancy
given small crown-rump length. Recommend follow-up US in 10-14 days
for definitive diagnosis. This recommendation follows SRU consensus
guidelines: Diagnostic Criteria for Nonviable Pregnancy Early in the
First Trimester. N Engl J Med 2809; [DATE].
2. 1.3 x 1.3 x 1.1 cm heterogeneous nonvascular area in the lower
uterine segment endometrium, indeterminate. Findings may represent
clot. Other etiologies such as endometrial mass can not be entirely
excluded.

## 2023-03-07 IMAGING — US US OB TRANSVAGINAL
1 series · 15 of 28 positions shown · non-contrast
Comparison: 11/26/2021.

CLINICAL DATA: Bleeding, passing clots.

EXAM:
TRANSVAGINAL OB ULTRASOUND
TECHNIQUE: Transvaginal ultrasound was performed for complete evaluation of the
gestation as well as the maternal uterus, adnexal regions, and
pelvic cul-de-sac.

[Series 1: us ob transvaginal · 30 acquisitions, 15 frames shown]
[im 1/30]
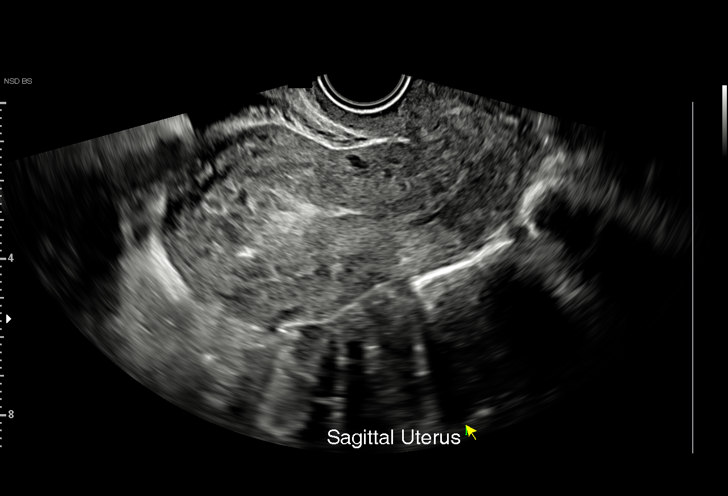
[im 3/30]
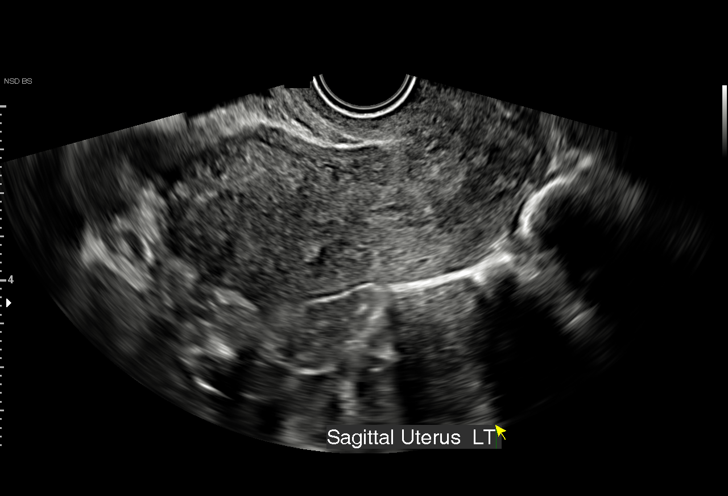
[im 5/30]
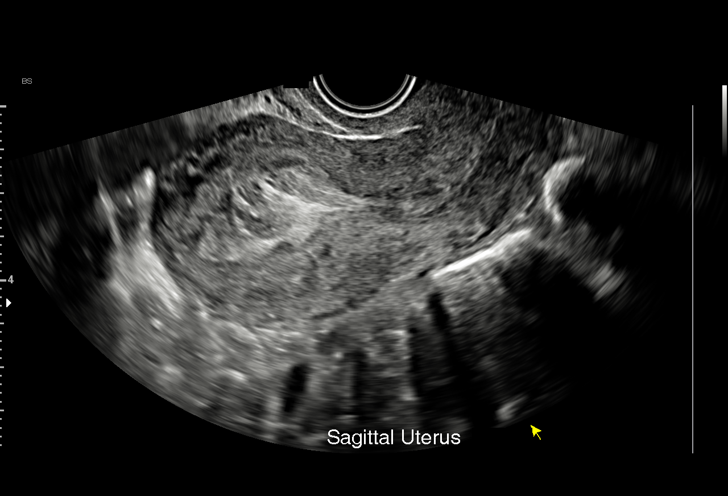
[im 7/30]
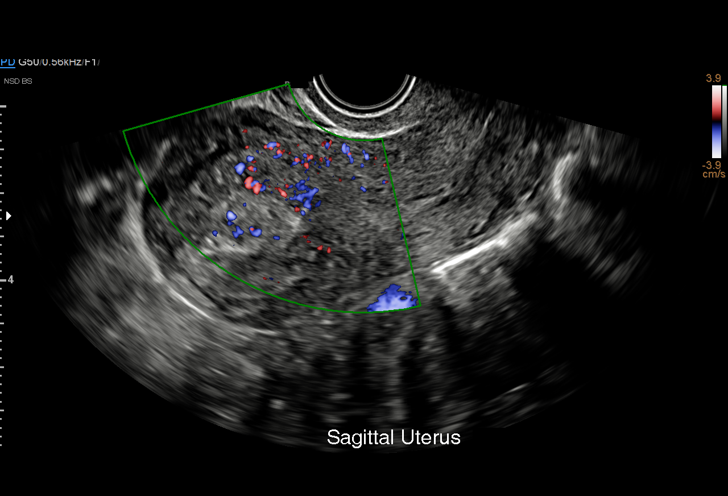
[im 9/30]
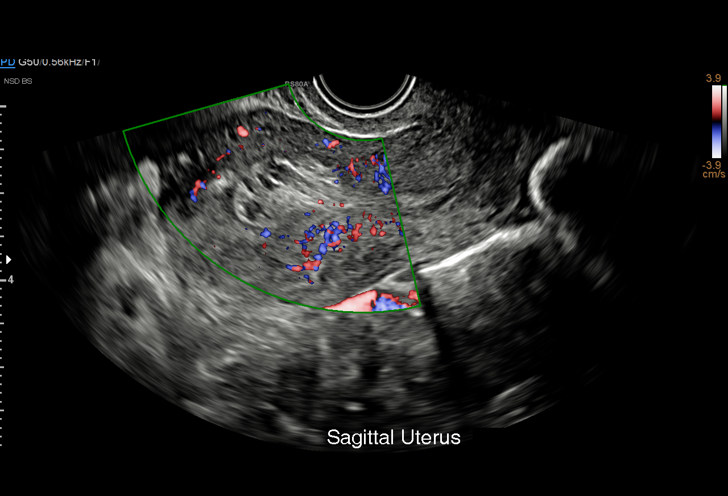
[im 11/30]
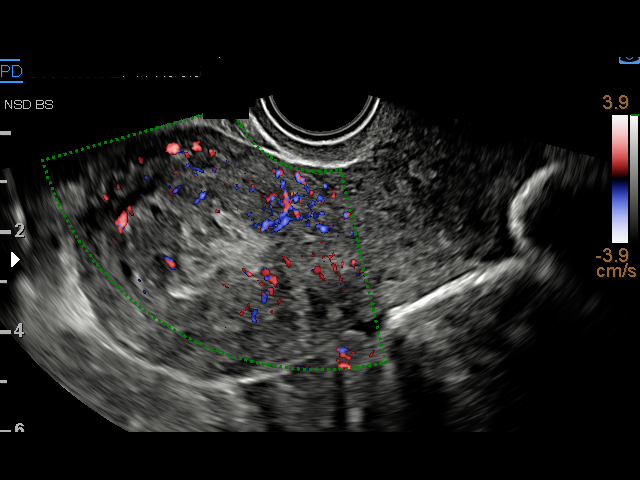
[im 13/30]
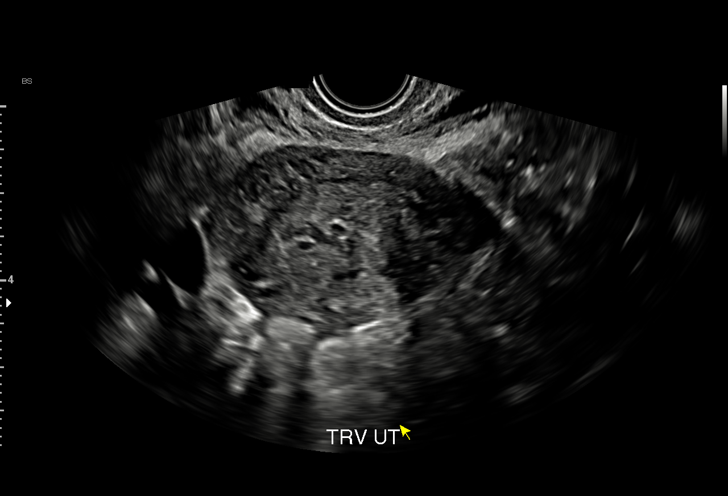
[im 16/30]
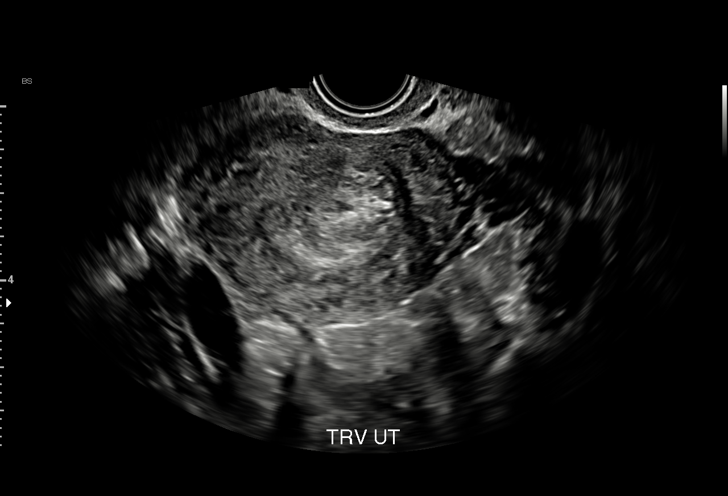
[im 17/30]
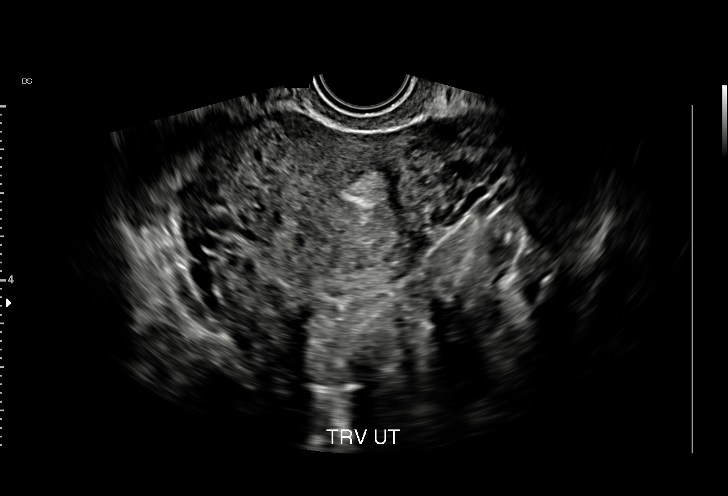
[im 19/30]
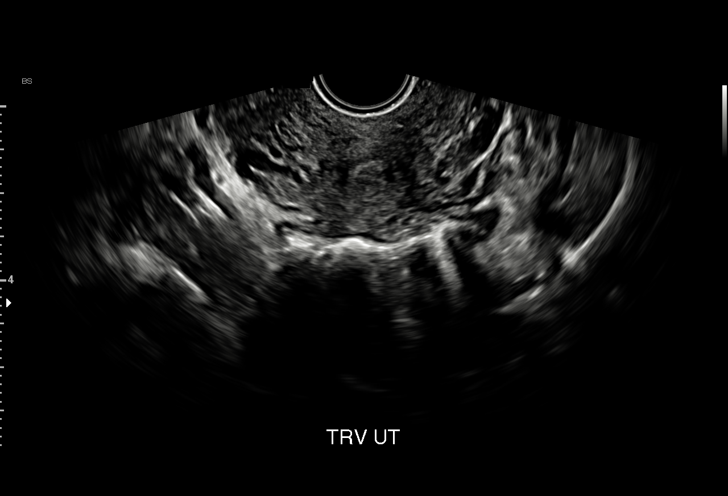
[im 21/30]
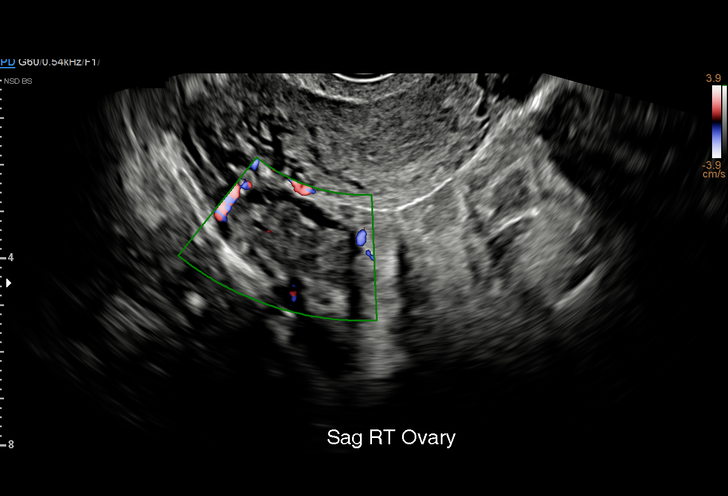
[im 23/30]
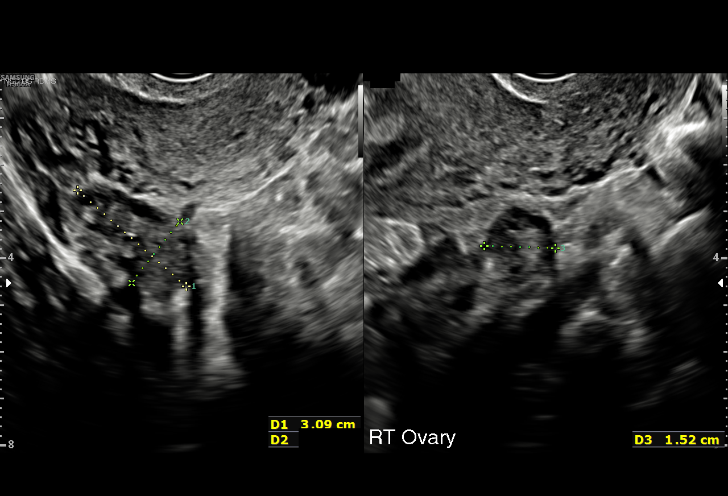
[im 25/30]
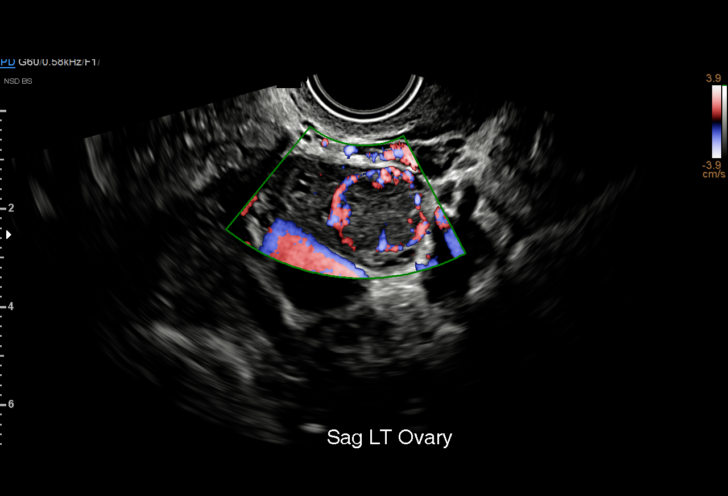
[im 27/30]
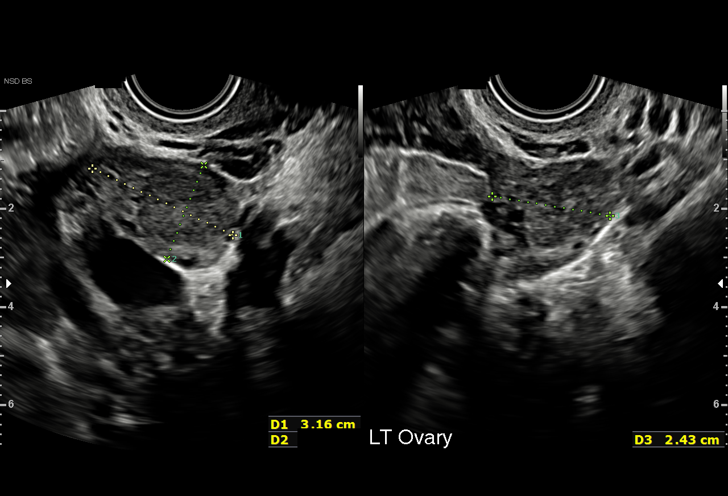
[im 30/30]
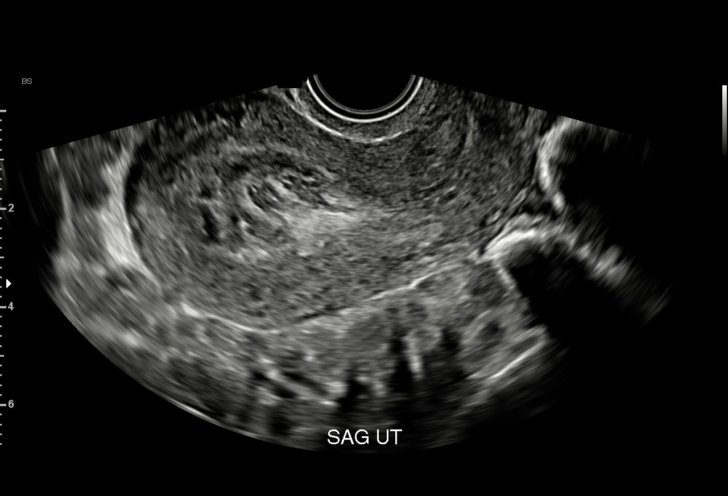

[15 of 28 positions shown; findings below may reference images not displayed]

FINDINGS: Intrauterine gestational sac: None.

Yolk sac:  No

Embryo:  No

Cardiac Activity: No

Heart Rate: None.

Maternal uterus/adnexae: No evidence of intrauterine gestational
sac. The endometrium is thickened measuring 2.4 cm, heterogeneous
and internal vascularity is noted. The right ovary is within normal
limits. A corpus luteal cyst is noted in the left ovary. No free
fluid in the pelvis.
IMPRESSION: No evidence of intrauterine pregnancy. The endometrium is thickened
and heterogeneous with internal vascularity suggesting retained
products of conception.

## 2023-04-16 ENCOUNTER — Other Ambulatory Visit: Payer: Medicaid Other

## 2023-04-27 ENCOUNTER — Other Ambulatory Visit: Payer: Medicaid Other

## 2023-04-27 ENCOUNTER — Other Ambulatory Visit: Payer: Self-pay

## 2023-04-27 ENCOUNTER — Ambulatory Visit (INDEPENDENT_AMBULATORY_CARE_PROVIDER_SITE_OTHER): Payer: 59 | Admitting: General Practice

## 2023-04-27 VITALS — BP 138/69 | HR 62 | Ht 65.0 in | Wt 185.0 lb

## 2023-04-27 DIAGNOSIS — Z3042 Encounter for surveillance of injectable contraceptive: Secondary | ICD-10-CM | POA: Diagnosis not present

## 2023-04-27 DIAGNOSIS — Z8619 Personal history of other infectious and parasitic diseases: Secondary | ICD-10-CM

## 2023-04-27 MED ORDER — MEDROXYPROGESTERONE ACETATE 150 MG/ML IM SUSY
150.0000 mg | PREFILLED_SYRINGE | Freq: Once | INTRAMUSCULAR | Status: AC
Start: 2023-04-27 — End: 2023-04-27
  Administered 2023-04-27: 150 mg via INTRAMUSCULAR

## 2023-04-27 NOTE — Progress Notes (Signed)
Paula Nunez here for Depo-Provera Injection. Injection administered without complication. Patient will return in 3 months for next injection between 10/18 and 11/1. Next annual visit due January 2025.   Marylynn Pearson, RN 04/27/2023  10:21 AM

## 2023-05-01 ENCOUNTER — Encounter: Payer: Self-pay | Admitting: Obstetrics and Gynecology

## 2023-05-02 ENCOUNTER — Encounter: Payer: Self-pay | Admitting: *Deleted

## 2023-05-02 ENCOUNTER — Telehealth: Payer: Self-pay | Admitting: *Deleted

## 2023-05-02 DIAGNOSIS — Z8619 Personal history of other infectious and parasitic diseases: Secondary | ICD-10-CM

## 2023-05-02 NOTE — Telephone Encounter (Addendum)
-----   Message from Warden Fillers sent at 05/01/2023  5:33 PM EDT ----- Not a 4 fold drop in titer, will need to recheck in another 6 months  8/7  1400 Called pt and heard message stating that the call could not be completed @ this time. Per chart review, pt actively reads her Mychart messages. Therefore a Mychart message was sent with results information and details of next lab appt.

## 2023-07-16 ENCOUNTER — Ambulatory Visit: Payer: Medicaid Other

## 2023-07-23 ENCOUNTER — Other Ambulatory Visit: Payer: Self-pay

## 2023-07-23 ENCOUNTER — Ambulatory Visit (INDEPENDENT_AMBULATORY_CARE_PROVIDER_SITE_OTHER): Payer: 59 | Admitting: *Deleted

## 2023-07-23 VITALS — BP 129/61 | HR 77 | Ht 65.0 in | Wt 187.7 lb

## 2023-07-23 DIAGNOSIS — Z3042 Encounter for surveillance of injectable contraceptive: Secondary | ICD-10-CM

## 2023-07-23 MED ORDER — MEDROXYPROGESTERONE ACETATE 150 MG/ML IM SUSY
150.0000 mg | PREFILLED_SYRINGE | Freq: Once | INTRAMUSCULAR | Status: AC
Start: 2023-07-23 — End: 2023-07-23
  Administered 2023-07-23: 150 mg via INTRAMUSCULAR

## 2023-07-23 NOTE — Progress Notes (Signed)
Here for depo-provera. Last given 04/27/23. Last annual exam 10/16/22. Last pap 07/20/21. Injection given without complaint. Sent to desk to schedule next injection with annual exam.  Nancy Fetter

## 2023-10-08 ENCOUNTER — Other Ambulatory Visit: Payer: Self-pay

## 2023-10-08 ENCOUNTER — Ambulatory Visit (INDEPENDENT_AMBULATORY_CARE_PROVIDER_SITE_OTHER): Payer: 59 | Admitting: Obstetrics and Gynecology

## 2023-10-08 ENCOUNTER — Encounter: Payer: Self-pay | Admitting: Obstetrics and Gynecology

## 2023-10-08 VITALS — BP 128/82 | HR 97 | Ht 65.0 in | Wt 195.8 lb

## 2023-10-08 DIAGNOSIS — Z30011 Encounter for initial prescription of contraceptive pills: Secondary | ICD-10-CM

## 2023-10-08 MED ORDER — NORETHIN ACE-ETH ESTRAD-FE 1-20 MG-MCG(24) PO TABS: 1.0000 | ORAL_TABLET | Freq: Every day | ORAL | 11 refills | Status: DC

## 2023-10-08 NOTE — Progress Notes (Signed)
 Pt wants to be on Birth Control Pills

## 2023-10-08 NOTE — Progress Notes (Signed)
   GYNECOLOGY PROGRESS NOTE  History:  23 y.o. [id number] presents to [facility] office today for birth control. She reports she would like to switch to OCP, does not like the irregularity of bleeding with depo. Denies hx of HTN, smoking or hx of clot   The following portions of the patient's history were reviewed and updated as appropriate: allergies, current medications, past family history, past medical history, past social history, past surgical history and problem list. Last pap smear on 2022 was normal  Health Maintenance Due  Topic Date Due   HPV VACCINES (1 - 3-dose series) Never done   INFLUENZA VACCINE  04/26/2023   COVID-19 Vaccine (1 - 2024-25 season) Never done   CHLAMYDIA SCREENING  08/17/2023     Review of Systems:  Pertinent items are noted in HPI.   Objective:  Physical Exam Blood pressure 128/82, pulse 97, height 5' 5 (1.651 m), weight 195 lb 12.8 oz (88.8 kg), last menstrual period 10/06/2023, not currently breastfeeding. VS reviewed, nursing note reviewed,  Constitutional: well developed, well nourished, no distress HEENT: normocephalic CV: normal rate Pulm/chest wall: normal effort Breast Exam: deferred Abdomen: soft Neuro: alert and oriented  Skin: warm, dry Psych: affect normal Pelvic exam: deferred  Assessment & Plan:  1. Encounter for initial prescription of contraceptive pills (Primary) Discussed OCP, side effect, initiation, missed pill window. She is within depo window, okay to switch today. No contraindication at this time. Rx sent to pharmacy.   Return in about 9 months (around 07/07/2024) for [place] Nidia Delores, FNP

## 2023-11-01 ENCOUNTER — Other Ambulatory Visit: Payer: Self-pay

## 2023-11-01 DIAGNOSIS — Z113 Encounter for screening for infections with a predominantly sexual mode of transmission: Secondary | ICD-10-CM

## 2023-11-02 ENCOUNTER — Other Ambulatory Visit: Payer: Self-pay

## 2023-11-02 DIAGNOSIS — Z113 Encounter for screening for infections with a predominantly sexual mode of transmission: Secondary | ICD-10-CM

## 2023-11-05 LAB — RPR: RPR Ser Ql: REACTIVE — AB

## 2023-11-05 LAB — RPR, QUANT+TP ABS (REFLEX)
Rapid Plasma Reagin, Quant: 1:2 {titer} — ABNORMAL HIGH
T Pallidum Abs: REACTIVE — AB

## 2024-01-14 ENCOUNTER — Ambulatory Visit

## 2024-01-22 ENCOUNTER — Ambulatory Visit

## 2024-01-22 ENCOUNTER — Other Ambulatory Visit (HOSPITAL_COMMUNITY)
Admission: RE | Admit: 2024-01-22 | Discharge: 2024-01-22 | Disposition: A | Discharge status: Home / Self Care | Source: Ambulatory Visit | Attending: Family Medicine | Admitting: Family Medicine

## 2024-01-22 VITALS — BP 131/61 | HR 88 | Ht 65.0 in | Wt 194.0 lb

## 2024-01-22 DIAGNOSIS — Z113 Encounter for screening for infections with a predominantly sexual mode of transmission: Secondary | ICD-10-CM | POA: Diagnosis present

## 2024-01-22 NOTE — Progress Notes (Signed)
 Paula Nunez is here with a request to do complete STI screening. Patient reports increased in vaginal discharge which she describes white and thick that has been present for approximately one month. Also reports being with same partner. Patient would like all blood STI testing except RPR. Had RPR testing done in 10/2023.   Self swab instructions given and specimen obtained. Explained patient will be contacted with any abnormal results. Patient is not due for annual exam. Annual due January 2026.   Lennart Quitter, RN 01/22/2024  3:54 PM

## 2024-01-23 LAB — CERVICOVAGINAL ANCILLARY ONLY
Bacterial Vaginitis (gardnerella): POSITIVE — AB
Candida Glabrata: NEGATIVE
Candida Vaginitis: NEGATIVE
Chlamydia: NEGATIVE
Comment: NEGATIVE
Comment: NEGATIVE
Comment: NEGATIVE
Comment: NEGATIVE
Comment: NEGATIVE
Comment: NORMAL
Neisseria Gonorrhea: NEGATIVE
Trichomonas: NEGATIVE

## 2024-01-23 LAB — HEPATITIS B SURFACE ANTIGEN: Hepatitis B Surface Ag: NEGATIVE

## 2024-01-23 LAB — HIV ANTIBODY (ROUTINE TESTING W REFLEX): HIV Screen 4th Generation wRfx: NONREACTIVE

## 2024-01-23 LAB — HEPATITIS C ANTIBODY: Hep C Virus Ab: NONREACTIVE

## 2024-01-24 ENCOUNTER — Encounter: Payer: Self-pay | Admitting: Family Medicine

## 2024-01-24 MED ORDER — METRONIDAZOLE 500 MG PO TABS: 500.0000 mg | ORAL_TABLET | Freq: Two times a day (BID) | ORAL | 0 refills | Status: AC

## 2024-01-24 NOTE — Addendum Note (Signed)
 Addended by: Granville Layer on: 01/24/2024 08:21 AM   Modules accepted: Orders

## 2024-04-08 ENCOUNTER — Other Ambulatory Visit: Payer: Self-pay

## 2024-04-08 ENCOUNTER — Emergency Department (HOSPITAL_COMMUNITY)
Admission: EM | Admit: 2024-04-08 | Discharge: 2024-04-08 | Disposition: A | Discharge status: Home / Self Care | Attending: Emergency Medicine | Admitting: Emergency Medicine

## 2024-04-08 ENCOUNTER — Encounter (HOSPITAL_COMMUNITY): Payer: Self-pay | Admitting: Pharmacy Technician

## 2024-04-08 DIAGNOSIS — M545 Low back pain, unspecified: Secondary | ICD-10-CM | POA: Diagnosis present

## 2024-04-08 LAB — HCG, SERUM, QUALITATIVE: Preg, Serum: NEGATIVE

## 2024-04-08 MED ORDER — METHOCARBAMOL 500 MG PO TABS: 500.0000 mg | ORAL_TABLET | Freq: Two times a day (BID) | ORAL | 0 refills | Status: AC

## 2024-04-08 MED ORDER — NAPROXEN 500 MG PO TABS: 500.0000 mg | ORAL_TABLET | Freq: Two times a day (BID) | ORAL | 0 refills | Status: AC

## 2024-04-08 NOTE — ED Triage Notes (Signed)
 PT arrives via POV. Pt c/o lower back pain since yesterday. Denies injury and denies associated symptoms. Pt is AxOx4.

## 2024-04-08 NOTE — Discharge Instructions (Signed)
I have prescribed muscle relaxers for your pain, please do not drink or drive while taking this medications as it can make you drowsy.     Please follow-up with PCP in 1 week for reevaluation of your symptoms.If you experience any bowel or bladder incontinence, fever, worsening in your symptoms please return to the ED.  

## 2024-04-08 NOTE — ED Provider Notes (Signed)
 [place] EMERGENCY DEPARTMENT AT [facility] Provider Note   CSN: [id number] Arrival date & time: 04/08/24  1454     Patient presents with: Back Pain   Paula Nunez is a 23 y.o. female.   23 y.o female with no PMH presents to the ED with a chief complaint of low back pain which has been ongoing since yesterday.  She does report she has 2 active boys, states that the pain feels like sharp aching to her lower back, exacerbated with any type of movement and lifting.  Alleviated by Anastacia powders, but later improved.  Denies any urinary symptoms, no IV drug use, no fever, no other complaints reported.  The history is provided by the patient.  Back Pain Location:  Lumbar spine Quality:  Aching Radiates to:  Does not radiate Associated symptoms: no fever        Prior to Admission medications   Medication Sig Start Date End Date Taking? Authorizing Provider  methocarbamol  (ROBAXIN ) 500 MG tablet Take 1 tablet (500 mg total) by mouth 2 (two) times daily for 7 days. 04/08/24 04/15/24 Yes Almond Fitzgibbon, PA-C  naproxen  (NAPROSYN ) 500 MG tablet Take 1 tablet (500 mg total) by mouth 2 (two) times daily for 7 days. 04/08/24 04/15/24 Yes Jullianna Gabor, PA-C  Norethindrone Acetate-Ethinyl Estrad-FE (LOESTRIN 24 FE) 1-20 MG-MCG(24) tablet Take 1 tablet by mouth daily. Patient not taking: Reported on 01/22/2024 10/08/23   Delores Nidia CROME, FNP    Allergies: Patient has no known allergies.    Review of Systems  Constitutional:  Negative for fever.  Genitourinary:  Negative for difficulty urinating.  Musculoskeletal:  Positive for back pain.    Updated Vital Signs BP 139/78 (BP Location: Right Arm)   Pulse 95   Temp 99.1 F (37.3 C) (Oral)   Resp 16   SpO2 99%   Physical Exam Vitals and nursing note reviewed.  Constitutional:      General: She is not in acute distress.    Appearance: She is well-developed.  HENT:     Head: Normocephalic and atraumatic.     Mouth/Throat:      Pharynx: No oropharyngeal exudate.  Eyes:     Pupils: Pupils are equal, round, and reactive to light.  Cardiovascular:     Rate and Rhythm: Regular rhythm.     Heart sounds: Normal heart sounds.  Pulmonary:     Effort: Pulmonary effort is normal. No respiratory distress.     Breath sounds: Normal breath sounds.  Abdominal:     General: Bowel sounds are normal. There is no distension.     Palpations: Abdomen is soft.     Tenderness: There is no abdominal tenderness.  Musculoskeletal:        General: Tenderness present. No deformity.     Cervical back: Normal range of motion.     Lumbar back: Tenderness present.     Right lower leg: No edema.     Left lower leg: No edema.     Comments: RLE- KF,KE 5/5 strength LLE- HF, HE 5/5 strength Normal gait. No pronator drift. No leg drop.  CN I, II and VIII not tested. CN II-XII grossly intact bilaterally.      Skin:    General: Skin is warm and dry.  Neurological:     Mental Status: She is alert and oriented to person, place, and time.     (all labs ordered are listed, but only abnormal results are displayed) Labs Reviewed  HCG, SERUM,  QUALITATIVE    EKG: None  Radiology: No results found.   Procedures   Medications Ordered in the ED - No data to display  Clinical Course as of 04/08/24 1832  Tue Apr 08, 2024  1758 Preg, Serum: NEGATIVE [JS]    Clinical Course User Index [JS] Cloria Ciresi, PA-C                                 Medical Decision Making Amount and/or Complexity of Data Reviewed Labs: ordered. Decision-making details documented in ED Course.  Risk Prescription drug management.    Patient presents to the ED with a chief complaint of low back pain which has been ongoing for 1 day.  Is a mother 2 toddlers does report a lot of activity.  No urinary symptoms, no IV drug use, no fever, no numbness.  Exam is benign.  No x-ray was obtained she denies any trauma or any other acute complaints.  Vitals are  within normal limits, aside from slight elevation of the heart rate.  Vital checks were rechecked, heart rate of 95, continues to deny any urinary symptoms, no concern for urinary tract infection some, no vaginal bleeding, no vaginal discharge.  Suspect likely MSK component, will treat supportive with muscle relaxers, anti-inflammatories.  Patient hemodynamically stable for discharge.  Portions of this note were generated with Scientist, clinical (histocompatibility and immunogenetics). Dictation errors may occur despite best attempts at proofreading.      Final diagnoses:  Acute bilateral low back pain without sciatica    ED Discharge Orders          Ordered    methocarbamol  (ROBAXIN ) 500 MG tablet  2 times daily        04/08/24 1817    naproxen  (NAPROSYN ) 500 MG tablet  2 times daily        04/08/24 1817               Jazlen Ogarro, PA-C 04/08/24 1833    Franklyn Sid SAILOR, MD 04/08/24 2003

## 2024-04-08 NOTE — ED Notes (Signed)
 Patient Alert and oriented to baseline. Stable and ambulatory to baseline. Patient verbalized understanding of the discharge instructions.  Patient belongings were taken by the patient.

## 2024-05-22 ENCOUNTER — Ambulatory Visit

## 2024-06-06 ENCOUNTER — Telehealth: Payer: Self-pay | Admitting: Family Medicine

## 2024-06-06 NOTE — Telephone Encounter (Signed)
 Patient called to say she wanted to get a test. I asked what kind of test, and she is requesting a self swab for STD. I asked if she had any symptoms, and she said no. I told her she may be charged for this visit, and she said it was fine.

## 2024-06-16 ENCOUNTER — Ambulatory Visit

## 2024-08-19 ENCOUNTER — Encounter (HOSPITAL_COMMUNITY): Payer: Self-pay

## 2024-08-19 ENCOUNTER — Other Ambulatory Visit: Payer: Self-pay

## 2024-08-19 ENCOUNTER — Emergency Department (HOSPITAL_COMMUNITY)
Admission: EM | Admit: 2024-08-19 | Discharge: 2024-08-19 | Disposition: A | Discharge status: Home / Self Care | Attending: Emergency Medicine | Admitting: Emergency Medicine

## 2024-08-19 DIAGNOSIS — J020 Streptococcal pharyngitis: Secondary | ICD-10-CM | POA: Diagnosis not present

## 2024-08-19 DIAGNOSIS — J029 Acute pharyngitis, unspecified: Secondary | ICD-10-CM | POA: Diagnosis present

## 2024-08-19 LAB — GROUP A STREP BY PCR: Group A Strep by PCR: DETECTED — AB

## 2024-08-19 LAB — MONONUCLEOSIS SCREEN: Mono Screen: NEGATIVE

## 2024-08-19 MED ORDER — AMOXICILLIN 500 MG PO CAPS
500.0000 mg | ORAL_CAPSULE | Freq: Once | ORAL | Status: AC
  Administered 2024-08-19: 500 mg via ORAL
  Filled 2024-08-19: qty 1

## 2024-08-19 MED ORDER — AMOXICILLIN 500 MG PO CAPS
500.0000 mg | ORAL_CAPSULE | Freq: Two times a day (BID) | ORAL | 0 refills | Status: AC
Start: 2024-08-19 — End: 2024-08-29

## 2024-08-19 MED ORDER — DEXAMETHASONE 4 MG PO TABS
10.0000 mg | ORAL_TABLET | Freq: Once | ORAL | Status: AC
  Administered 2024-08-19: 10 mg via ORAL
  Filled 2024-08-19: qty 3

## 2024-08-19 NOTE — ED Provider Notes (Signed)
 [place] EMERGENCY DEPARTMENT AT [facility] Provider Note   CSN: [id number] Arrival date & time: 08/19/24  1818     Patient presents with: Sore Throat and URI   Paula Nunez is a 23 y.o. female.  Patient with past history significant for anemia, depression, and anxiety here with concerns of a sore throat.  Reportedly has been experiencing sore throat with nasal congestion and cough for the last 2 or 3 days.  States she has had some nausea due to difficulty swallowing from the amount of pain her throat is improved has been taking over-the-counter medication with minimal improvement in symptoms.  No reported measured temperatures or fevers at home.   Sore Throat  URI Presenting symptoms: sore throat        Prior to Admission medications   Medication Sig Start Date End Date Taking? Authorizing Provider  amoxicillin  (AMOXIL ) 500 MG capsule Take 1 capsule (500 mg total) by mouth 2 (two) times daily for 10 days. 08/19/24 08/29/24 Yes Andreas Sobolewski A, PA-C  Norethindrone Acetate-Ethinyl Estrad-FE (LOESTRIN 24 FE) 1-20 MG-MCG(24) tablet Take 1 tablet by mouth daily. Patient not taking: Reported on 01/22/2024 10/08/23   Delores Nidia CROME, FNP    Allergies: Patient has no known allergies.    Review of Systems  HENT:  Positive for sore throat.   All other systems reviewed and are negative.   Updated Vital Signs BP 136/82   Pulse 80   Temp 98.4 F (36.9 C) (Oral)   Resp 18   SpO2 100%   Physical Exam Vitals and nursing note reviewed.  Constitutional:      General: She is not in acute distress.    Appearance: She is well-developed.  HENT:     Head: Normocephalic and atraumatic.     Mouth/Throat:     Mouth: Mucous membranes are moist.     Pharynx: Uvula midline. No posterior oropharyngeal erythema or uvula swelling.     Tonsils: Tonsillar exudate present. No tonsillar abscesses. 2+ on the right. 2+ on the left.  Eyes:     Conjunctiva/sclera: Conjunctivae  normal.  Cardiovascular:     Rate and Rhythm: Normal rate and regular rhythm.     Heart sounds: No murmur heard. Pulmonary:     Effort: Pulmonary effort is normal. No respiratory distress.     Breath sounds: Normal breath sounds.  Abdominal:     Palpations: Abdomen is soft.     Tenderness: There is no abdominal tenderness.  Musculoskeletal:        General: No swelling.     Cervical back: Neck supple.  Skin:    General: Skin is warm and dry.     Capillary Refill: Capillary refill takes less than 2 seconds.  Neurological:     Mental Status: She is alert.  Psychiatric:        Mood and Affect: Mood normal.     (all labs ordered are listed, but only abnormal results are displayed) Labs Reviewed  GROUP A STREP BY PCR - Abnormal; Notable for the following components:      Result Value   Group A Strep by PCR DETECTED (*)    All other components within normal limits  MONONUCLEOSIS SCREEN    EKG: None  Radiology: No results found.   Procedures   Medications Ordered in the ED  dexamethasone  (DECADRON ) tablet 10 mg (10 mg Oral Given 08/19/24 2216)  amoxicillin  (AMOXIL ) capsule 500 mg (500 mg Oral Given 08/19/24 2216)  Medical Decision Making Risk Prescription drug management.   This patient presents to the ED for concern of sore throat, URI.  Differential diagnosis includes viral URI, COVID-19, influenza, strep pharyngitis, peritonsillar abscess    Additional history obtained:  Additional history obtained from chart review   Lab Tests:  I Ordered, and personally interpreted labs.  The pertinent results include: Mononucleosis screen negative, group A strep positive   Medicines ordered and prescription drug management:  I ordered medication including Decadron , amoxicillin  for strep pharyngitis Reevaluation of the patient after these medicines showed that the patient improved I have reviewed the patients home medicines and  have made adjustments as needed   Problem List / ED Course:  Patient presented to the ED with concerns of a sore throat and cough. States symptoms have been ongoing for several days with no measured fevers at home. Denies obvious sick contacts. States pain is making it difficult to eat and drink. Denies abdominal pain, diarrhea, chest pain, or shortness of breath. On exam, patient has notable oropharyngeal erythema, tonsillar exudate, and anterior cervical chain adenopathy. No abnormal heart or lung sounds. Abdomen is soft and non-tender. Mono screen negative. Group A strep positive. Given clinical findings, this is her current cause of her symptoms. Given time and pharmacy availability, will given dose of Decadron  and start amoxicillin  here in the ED. Advised droplet precautions and advised to return to the ED for any concerns of unilateral swelling, trouble speaking, or difficulty breathing. She is otherwise stable for outpatient follow up and discharged home.   Social Determinants of Health:  None  Final diagnoses:  Strep pharyngitis    ED Discharge Orders          Ordered    amoxicillin  (AMOXIL ) 500 MG capsule  2 times daily        08/19/24 2209               Elizabelle Fite A, PA-C 08/19/24 7698    Doretha Folks, MD 08/20/24 [phone]

## 2024-08-19 NOTE — ED Provider Triage Note (Signed)
 Emergency Medicine Provider Triage Evaluation Note  Paula Nunez , a 23 y.o. female  was evaluated in triage.  Pt complains of sore throat. Endorse sore throat, congestion, lower back pain, and chills x 4 days.  Having trouble swallowing due to the pain. No cp or sob, no abd pain.  No vaginal bleeding or vaginal discharge  Review of Systems  Positive: As above Negative: As above  Physical Exam  BP 136/68 (BP Location: Right Arm)   Pulse (!) 104   Temp 98.4 F (36.9 C) (Oral)   Resp 18   SpO2 99%  Gen:   Awake, no distress   Resp:  Normal effort  MSK:   Moves extremities without difficulty  Other:  Throat: bilateral tonsillar enlargement with exudates  Medical Decision Making  Medically screening exam initiated at 6:44 PM.  Appropriate orders placed.  Ann Mcconnell was informed that the remainder of the evaluation will be completed by another provider, this initial triage assessment does not replace that evaluation, and the importance of remaining in the ED until their evaluation is complete.     Nivia Colon, PA-C 08/19/24 [phone]

## 2024-08-19 NOTE — ED Triage Notes (Signed)
 Pt gives verbal consent for mse

## 2024-08-19 NOTE — ED Triage Notes (Signed)
 Pt coming in reporting nasal congestion and sore throat. Pt reports nausea and one episode of vomiting.

## 2024-08-19 NOTE — Discharge Instructions (Signed)
 You were seen in the emergency department today for concerns of sore throat.  You tested positive for strep which is a bacterial infection causing your current symptoms.  You were given initial dose of steroids in the emergency department as well as your first dose of antibiotics.  I sent this prescription for antibiotics to be taken twice daily for the next 10 days to your pharmacy.  For any concerns of worsening pain, difficulty swallowing or swelling to only 1 side of your throat, return to the emergency department.

## 2024-09-09 ENCOUNTER — Encounter (HOSPITAL_COMMUNITY): Payer: Self-pay | Admitting: Obstetrics and Gynecology

## 2024-09-09 ENCOUNTER — Inpatient Hospital Stay (HOSPITAL_COMMUNITY)
Admission: AD | Admit: 2024-09-09 | Discharge: 2024-09-09 | Disposition: A | Discharge status: Home / Self Care | Attending: Obstetrics and Gynecology | Admitting: Obstetrics and Gynecology

## 2024-09-09 ENCOUNTER — Other Ambulatory Visit: Payer: Self-pay

## 2024-09-09 DIAGNOSIS — Z113 Encounter for screening for infections with a predominantly sexual mode of transmission: Secondary | ICD-10-CM | POA: Diagnosis not present

## 2024-09-09 DIAGNOSIS — B3731 Acute candidiasis of vulva and vagina: Secondary | ICD-10-CM

## 2024-09-09 DIAGNOSIS — Z3202 Encounter for pregnancy test, result negative: Secondary | ICD-10-CM | POA: Diagnosis not present

## 2024-09-09 DIAGNOSIS — R109 Unspecified abdominal pain: Secondary | ICD-10-CM | POA: Diagnosis present

## 2024-09-09 LAB — HEPATITIS B SURFACE ANTIGEN: Hepatitis B Surface Ag: NONREACTIVE

## 2024-09-09 LAB — WET PREP, GENITAL
Clue Cells Wet Prep HPF POC: NONE SEEN
Sperm: NONE SEEN
Trich, Wet Prep: NONE SEEN
WBC, Wet Prep HPF POC: <10 (ref <10)

## 2024-09-09 LAB — URINALYSIS, ROUTINE W REFLEX MICROSCOPIC
Bilirubin Urine: NEGATIVE
Glucose, UA: NEGATIVE mg/dL
Hgb urine dipstick: NEGATIVE
Ketones, ur: NEGATIVE mg/dL
Leukocytes,Ua: NEGATIVE
Nitrite: NEGATIVE
Protein, ur: NEGATIVE mg/dL
Specific Gravity, Urine: 1.025 (ref 1.005–1.030)
pH: 6 (ref 5.0–8.0)

## 2024-09-09 LAB — POCT PREGNANCY, URINE: Preg Test, Ur: NEGATIVE

## 2024-09-09 LAB — HIV ANTIBODY (ROUTINE TESTING W REFLEX): HIV Screen 4th Generation wRfx: NONREACTIVE

## 2024-09-09 LAB — HEPATITIS C ANTIBODY: HCV Ab: NONREACTIVE

## 2024-09-09 MED ORDER — FLUCONAZOLE 150 MG PO TABS: 150.0000 mg | ORAL_TABLET | ORAL | 0 refills | Status: AC | PRN

## 2024-09-09 NOTE — MAU Note (Signed)
 Paula Nunez is a 23 y.o. at Unknown here in MAU reporting: she's been having intermittent lower abdominal cramping, denies VB.  Denies vaginal itching and abnormal vaginal discharge.  Desires STI testing, states concerned may been exposed to STI by partner.  Reports wants vaginal cultures and blood work.  Took HPT, results inconclusive  LMP: 08/07/2024 Onset of complaint: 2 days ago Pain score: 6 Vitals:   09/09/24 1806  BP: 139/75  Pulse: 60  Resp: 20  Temp: 98.1 F (36.7 C)  SpO2: 100%     FHT: NA  Lab orders placed from triage: UPT

## 2024-09-09 NOTE — Discharge Instructions (Signed)
 EFFECTIVENESS OF BIRTH CONTROL Birth control methods vary widely with respect to their effectiveness. Contraceptives can fail for a number of reasons, including incorrect use and failure of the medication, device, or method itself. Certain birth control methods, such as intrauterine devices (IUDs) and the implant have the lowest risk of failure (pregnancy). This is because they are the easiest to use properly. You should consider these methods if you want the lowest chance of a mistake or failure, which could lead to pregnancy. (See Patient education: Long-acting methods of birth control (Beyond the Basics).) Overall, birth control methods that are designed for use at or near the time of sex (eg, condoms, diaphragm) are generally less effective than other birth control methods (eg, the IUD, the implant, and birth control pills). If you forget to use birth control or if your method fails, there are options to reduce your risk of becoming pregnant for up to five days after you have sex. This is known as emergency contraception. (See Patient education: Emergency contraception (Beyond the Basics).) CHOOSING A BIRTH CONTROL METHOD It can be difficult to decide which birth control method is best because of the wide variety of options available. The best method is one that you will use consistently, is acceptable to you and your partner, and does not cause bothersome side effects. Other factors to consider include: ? How effective is the method? ? Is it convenient? Do I have to remember to use it? If so, will I remember to use it? ? Do I have to use/take it every day? ? Is this method reversible? Can I get pregnant immediately after stopping it? ? Will this method cause me to bleed more or less? Will the bleeding I have while using the method be predictable or not predictable? ? Are there side effects or potential complications? ? Is this method affordable? ? Does this method protect against  sexually transmitted diseases? ? Will it be difficult to discontinue this method if I choose to do so?  You should also consider how easy it is to get your birth control. For some forms, you need to see a doctor for a prescription. But there may be other options; for example, in some areas, you can get birth control pills online through services such as [employer]urx (www.nurx.com) or [employer] (www.prjktruby.com). There are other online resources available as well. No method of birth control is perfect. You must balance the advantages and disadvantages of each method and then choose the method that you will be able to use consistently and correctly. INTRAUTERINE DEVICES (IUD) IUDs are placed by a health care provider through the vagina and cervix, into the uterus. The currently available IUDs are safe and effective. These devices include: ? Copper-containing IUD - The Copper-containing IUD remains effective for at least 10 years, but can be removed at any time. The Copper IUD does not contain any hormones. Some people have heavier or longer bleeding during their period while using a copper IUD.  ? Levonorgestrel-releasing IUD - The levonorgestrel-releasing IUD (which is available in different sizes and doses) releases a hormone, levonorgestrel, which thickens the cervical mucus and thins the endometrium (the lining of the uterus). This IUD also decreases the amount you bleed during your period and decreases pain associated with periods. These hormone-containing IUDs can be left in place for up to three to eight years (depending on the type of IUD chosen) but can be removed at any time. They are highly effective in preventing pregnancy. Some people stop  having menstrual periods entirely; this effect is reversed when the IUD is removed.  BIRTH CONTROL IMPLANT A single-rod progestin implant, Nexplanon, is available in the [place]  and elsewhere. It is inserted by a health care provider into your arm and is  highly effective in preventing pregnancy. While it prevents pregnancy for at least 3 years as the hormone is slowly absorbed into the body, it can be removed at any time. It is effective within 7 days following insertion. Irregular bleeding is the most bothersome side effect. Fertility returns quickly after the rod is removed. (See Patient education: Hormonal methods of birth control (Beyond the Basics).) INJECTABLE BIRTH CONTROL The only injectable method of birth control currently available in the [place]  is medroxyprogesterone  acetate or DMPA (Depo-Provera ). This is a progestin hormone, which is long-lasting. DMPA is injected deep into a muscle, such as the buttock or upper arm, once every three months. A version that is given under the skin is also available. DMPA is very effective, when used consistently. A full discussion is available separately. (See Patient education: Hormonal methods of birth control (Beyond the Basics).) Side effects -- The most common side effects of DMPA are irregular or prolonged vaginal bleeding and spotting, particularly during the first three to six months. Up to 50 percent of people completely stop having menstrual periods after using DMPA for one year. Although ovulation and menstrual periods generally return within six months of the last DMPA injection, it can take up to a year and a half for ovulation and cycles to return. For this reason, DMPA should be used only by people who do not wish to become pregnant in the next year or longer. BIRTH CONTROL PILLS Most birth control pills, also referred to as the pill, contain a combination of two female hormones. A full discussion of birth control pills is available separately. (See Patient education: Hormonal methods of birth control (Beyond the Basics).) How well do they work? -- When taken properly, birth control pills are effective. In general, if you miss one pill, you should take it as soon as possible. If you  miss two or more pills, continue to take one pill per day and use a back-up method of birth control (eg, a condom) for seven days. If you miss two or more pills, you should also consider taking the morning after (emergency contraception) pill. (See Patient education: Emergency contraception (Beyond the Basics).) Side effects -- Side effects of the pill include: ? Nausea, breast tenderness, bloating, and mood changes, which typically improve after two to three months.  ? Irregular vaginal spotting or bleeding. This is particularly common during the first few months. Forgetting a pill can also cause irregular bleeding.  Progestin-only pills -- Unlike traditional birth control pills, the progestin-only pill, also called the mini pill, does not contain estrogen. It does contain progestin, a hormone that is similar to the female hormone, progesterone. This type of pill is useful for people who cannot or should not take estrogen. Progestin-only pills are as effective as combination pills if they are taken at the same time every day. However, the norethindrone progestin-only pill becomes less effective if you are more than three hours late in taking it, in which case, emergency contraceptives may be considered. This concern does not apply to a newer progestin-only pill that contains drospirenone. SKIN PATCHES Birth control skin patches contain two hormones, estrogen and progestin, similar to birth control pills, and may be preferred by some people because you do not have  to take them every day. Although birth control skin patches are as effective as birth control pills in people who are categorized as normal weight based on their body mass index (BMI), they are less effective in those who fall into the overweight category. One of the skin patch birth control methods available in the [place]  is sold under the brand name Xulane. A newer skin patch (brand name: Margorie) releases a lower amount of estrogen  than Xulane. You wear the patch for one week on the upper arm, shoulder, upper back, or hip. After one week, you remove the old patch and apply a new patch; you repeat this for three weeks. During the fourth week, you do not wear a patch and withdrawal bleeding occurs during this week. Each new patch should be started on the same day of the week. The risks and side effects of the patch are similar to those of a birth control pill, although there may be a slightly higher risk of developing a blood clot. Because obesity (having a BMI of 30 kg/m2 or greater) alone is a risk factor for developing a blood clot, people who fall into this category should not use birth control skin patches. VAGINAL RING A flexible plastic vaginal ring contains estrogen and a progestin. You insert the ring in your vagina, where hormones are slowly absorbed into your body. You wear the ring inside the vagina for three weeks, followed by one week when you do not wear the ring; bleeding occurs during the fourth week. With one type of ring (sample brand names: NuvaRing, EluRyng), a new ring is placed each four weeks. With the other type (brand name: Annovera), the same ring is used for up to one year (13 28-day cycles). The vaginal ring prevents pregnancy similarly to the birth control pill, and the risks and side effects are similar. More information about the vaginal ring is available separately. (See Patient education: Hormonal methods of birth control (Beyond the Basics).) BARRIER METHODS Barrier contraceptives prevent sperm from entering the uterus. Barrier contraceptives include the condom, diaphragm, and cervical cap. A full discussion of barrier methods of birth control is available separately. (See Patient education: Barrier and pericoital methods of birth control (Beyond the Basics).) External condom -- The external (formerly female) condom is a thin, flexible sheath placed over the penis to prevent semen from entering the  partner's body. To be effective, it is important to carefully follow instructions when using condoms, and to use them every time you have sex. Many people who choose another method of birth control (eg, pills) also use condoms to decrease their risk of getting a sexually transmitted infection (STI). External condoms can also be used during anal sex to lower the risk of STIs. Internal condom -- The internal (formerly female) condom is worn inside the vagina to prevent semen from entering. It is a sheath made of polyurethane and is prelubricated. One ring-shaped part of this method remains inside the vagina while a second ring-shaped part remains outside the vagina. Diaphragm/cervical cap -- The diaphragm and cervical cap fit over the cervix, preventing sperm from entering the uterus. These devices are available in latex (the Prentif cap) or silicone rubber (FemCap) in multiple sizes, and require fitting by a clinician. These devices must be used with a spermicide and left in place for six to eight hours after sex. The diaphragm must be removed after this period. However, the cervical cap can remain in place for up to 24 hours. Spermicide --  Spermicides are chemical substances that destroy sperm. They are available in most pharmacies without a prescription. Spermicides are available in a variety of forms including gel, foam, cream, film, sponge, suppository, and tablet. PERMANENT BIRTH CONTROL This is a procedure that permanently prevents you from becoming pregnant or getting a partner pregnant. Tubal ligation and vasectomy are the two most common permanent birth control procedures. These procedures are permanent, and should only be considered after you discuss all available options with a health care provider and if you are certain you wish to permanently prevent pregnancy. (See Patient education: Permanent birth control for females (Beyond the Basics) and Patient education: Vasectomy (Beyond the  Basics).) Tubal ligation -- Tubal ligation is a procedure that surgically cuts, blocks, seals or removes the fallopian tubes to prevent pregnancy. The procedure is usually done in an operating room as a day surgery. Tubal ligation can be performed at the time of cesarean birth (c-section), or in the hospital following vaginal delivery. The procedure may be done at another time as well. This is discussed in more detail separately. (See Patient education: Permanent birth control for females (Beyond the Basics).) Vasectomy -- Vasectomy is a procedure that cuts or blocks the vas deferens, the tubes that carry sperm from the testes. It is a safe, highly effective procedure that can be performed in a doctor's office under local anesthesia. Following vasectomy, you must use another method of birth control (eg, condoms) for approximately three months, until testing confirms that no sperm are present in the semen. This is discussed in more detail separately. (See Patient education: Vasectomy (Beyond the Basics).) OTHER BIRTH CONTROL METHODS Some people cannot or choose not to use the birth control methods mentioned above due to religious, cultural, or personal reasons. Fertility-awareness based methods for preventing pregnancy are based upon the physiological changes during the menstrual cycle. These methods, also called natural family planning, involve identifying the fertile days of the menstrual cycle using a combination of cycle length and physical manifestations of ovulation (change in cervical secretions, basal body temperature) and then avoiding vaginal sex or using barrier methods on those days. Smartphone apps are available that may help with tracking cycles. The effectiveness of fertility-awareness methods in preventing pregnancy is lower than for the methods detailed above. EMERGENCY CONTRACEPTION Emergency contraception refers to the use of medication after unprotected sex to prevent pregnancy.  Types of emergency contraception include the levonorgestrel or copper intrauterine device (IUD) or pills. You can use emergency contraception if you forget to take your birth control pill, if a condom breaks during sex, or if you have unprotected sex for other reasons (including victims of sexual assault). An IUD can be inserted for use as emergency contraception and is much more effective at preventing a pregnancy than pills. It is the best choice for emergency contraception, and you can continue to use it as your ongoing method of birth control. The other options are morning after pills, which may be hormonal (eg, Plan B One-Step, which is available without a prescription) or nonhormonal (eg, Toy, available only by prescription). Detailed information on emergency contraception is available separately. (See Patient education: Emergency contraception (Beyond the Basics).) WHERE TO GET MORE INFORMATION Your health care provider is the best source of information for questions and concerns related to your medical problem. An excellent website to help you choose a method of birth control is www.bedsider.org.

## 2024-09-09 NOTE — MAU Provider Note (Signed)
 History     CSN: [id number]  Arrival date and time: 09/09/24 1738   Event Date/Time   First Provider Initiated Contact with Patient 09/09/24 1831      Chief Complaint  Patient presents with   Abdominal Pain   STI Testing   HPI Paula Nunez is a 23 y.o. female who presents for STI testing & abdominal cramping.  Has been with current female partner x 4 months & does not use condoms. States he recently said he didn't feel right so she wanted to get checked for STD. Reports vaginal itching/irritation. Denies dysuria, vaginal bleeding, or vaginal discharge. Has had intermittent abdominal cramping x 2 weeks. No fever, n/v/d. LMP 11/13  OB History     Gravida  3   Para  2   Term  2   Preterm      AB  1   Living  2      SAB  1   IAB      Ectopic      Multiple  0   Live Births  2           Past Medical History:  Diagnosis Date   Anemia    Anxiety    Depression    doing ok now   Gestational diabetes 11/08/2020   Gestational hypertension 11/09/2020   Headache    Syphilis     Past Surgical History:  Procedure Laterality Date   NO PAST SURGERIES      Family History  Problem Relation Age of Onset   Healthy Mother    Healthy Father    Hypertension Maternal Grandmother    Diabetes Other    Asthma Neg Hx    Cancer Neg Hx    Heart disease Neg Hx     Social History[1]  Allergies: Allergies[2]  No medications prior to admission.    Review of Systems  All other systems reviewed and are negative.  Physical Exam   Blood pressure 139/75, pulse 60, temperature 98.1 F (36.7 C), temperature source Oral, resp. rate 20, height 5' 5 (1.651 m), weight 71.2 kg, last menstrual period 08/07/2024, SpO2 100%.  Physical Exam Vitals and nursing note reviewed.  Constitutional:      General: She is not in acute distress.    Appearance: She is well-developed. She is not ill-appearing.  HENT:     Head: Normocephalic and atraumatic.  Eyes:      General: No scleral icterus.       Right eye: No discharge.        Left eye: No discharge.     Conjunctiva/sclera: Conjunctivae normal.  Pulmonary:     Effort: Pulmonary effort is normal. No respiratory distress.  Neurological:     General: No focal deficit present.     Mental Status: She is alert.  Psychiatric:        Mood and Affect: Mood normal.        Behavior: Behavior normal.     MAU Course  Procedures Results for orders placed or performed during the hospital encounter of 09/09/24 (from the past 24 hours)  Wet prep, genital     Status: Abnormal   Collection Time: 09/09/24  6:23 PM   Specimen: PATH Cytology Cervicovaginal Ancillary Only  Result Value Ref Range   Yeast Wet Prep HPF POC PRESENT (A) NONE SEEN   Trich, Wet Prep NONE SEEN NONE SEEN   Clue Cells Wet Prep HPF POC NONE SEEN NONE SEEN   WBC, Wet  Prep HPF POC <10 <10   Sperm NONE SEEN   Pregnancy, urine POC     Status: None   Collection Time: 09/09/24  6:24 PM  Result Value Ref Range   Preg Test, Ur NEGATIVE NEGATIVE  Urinalysis, Routine w reflex microscopic -Urine, Clean Catch     Status: None   Collection Time: 09/09/24  6:26 PM  Result Value Ref Range   Color, Urine YELLOW YELLOW   APPearance CLEAR CLEAR   Specific Gravity, Urine 1.025 1.005 - 1.030   pH 6.0 5.0 - 8.0   Glucose, UA NEGATIVE NEGATIVE mg/dL   Hgb urine dipstick NEGATIVE NEGATIVE   Bilirubin Urine NEGATIVE NEGATIVE   Ketones, ur NEGATIVE NEGATIVE mg/dL   Protein, ur NEGATIVE NEGATIVE mg/dL   Nitrite NEGATIVE NEGATIVE   Leukocytes,Ua NEGATIVE NEGATIVE    MDM   Assessment and Plan   1. Vaginal yeast infection   2. Screening examination for STI    -Wet prep positive for yeast. Rx diflucan  -Remaining STD testing in process -Discussed with patient that abdominal cramping would not be evaluated in MAU with negative pregnancy test. If symptoms worsen or she is concerned, should f/u with urgent care or ED.    Rocky Satterfield 09/09/2024,  7:58 PM      [1]  Social History Tobacco Use   Smoking status: Never   Smokeless tobacco: Never  Vaping Use   Vaping status: Never Used  Substance Use Topics   Alcohol use: Not Currently    Alcohol/week: 1.0 standard drink of alcohol    Types: 1 Glasses of wine per week   Drug use: Not Currently    Types: Marijuana    Comment: Last use 08/15/2022  [2] No Known Allergies

## 2024-09-10 LAB — SYPHILIS: RPR W/REFLEX TO RPR TITER AND TREPONEMAL ANTIBODIES, TRADITIONAL SCREENING AND DIAGNOSIS ALGORITHM
RPR Ser Ql: REACTIVE — AB
RPR Titer: 1:2 {titer}

## 2024-09-10 LAB — GC/CHLAMYDIA PROBE AMP (~~LOC~~) NOT AT ARMC
Chlamydia: NEGATIVE
Comment: NEGATIVE
Comment: NORMAL
Neisseria Gonorrhea: POSITIVE — AB

## 2024-09-11 ENCOUNTER — Ambulatory Visit (HOSPITAL_COMMUNITY): Payer: Self-pay

## 2024-09-11 LAB — T.PALLIDUM AB, TOTAL: T Pallidum Abs: REACTIVE — AB
